# Patient Record
Sex: Male | Born: 1950
Health system: Southern US, Community
[De-identification: ages and names within clinical notes are randomized; demographics above are authoritative.]

## PROBLEM LIST (undated history)

## (undated) DIAGNOSIS — N200 Calculus of kidney: Secondary | ICD-10-CM

## (undated) DIAGNOSIS — F419 Anxiety disorder, unspecified: Secondary | ICD-10-CM

## (undated) DIAGNOSIS — T7840XA Allergy, unspecified, initial encounter: Secondary | ICD-10-CM

## (undated) DIAGNOSIS — G629 Polyneuropathy, unspecified: Secondary | ICD-10-CM

## (undated) DIAGNOSIS — M1990 Unspecified osteoarthritis, unspecified site: Secondary | ICD-10-CM

## (undated) DIAGNOSIS — H269 Unspecified cataract: Secondary | ICD-10-CM

## (undated) DIAGNOSIS — C801 Malignant (primary) neoplasm, unspecified: Secondary | ICD-10-CM

## (undated) DIAGNOSIS — I10 Essential (primary) hypertension: Secondary | ICD-10-CM

## (undated) DIAGNOSIS — K219 Gastro-esophageal reflux disease without esophagitis: Secondary | ICD-10-CM

## (undated) DIAGNOSIS — E785 Hyperlipidemia, unspecified: Secondary | ICD-10-CM

## (undated) DIAGNOSIS — R011 Cardiac murmur, unspecified: Secondary | ICD-10-CM

## (undated) HISTORY — DX: Gastro-esophageal reflux disease without esophagitis: K21.9

## (undated) HISTORY — DX: Essential (primary) hypertension: I10

## (undated) HISTORY — DX: Hyperlipidemia, unspecified: E78.5

## (undated) HISTORY — DX: Unspecified cataract: H26.9

## (undated) HISTORY — DX: Anxiety disorder, unspecified: F41.9

## (undated) HISTORY — PX: CERVICAL DISCECTOMY: SHX98

## (undated) HISTORY — DX: Allergy, unspecified, initial encounter: T78.40XA

## (undated) HISTORY — DX: Polyneuropathy, unspecified: G62.9

## (undated) HISTORY — DX: Calculus of kidney: N20.0

## (undated) HISTORY — PX: CERVICAL FUSION: SHX112

## (undated) HISTORY — PX: OTHER SURGICAL HISTORY: SHX169

## (undated) HISTORY — PX: SPINE SURGERY: SHX786

## (undated) HISTORY — DX: Cardiac murmur, unspecified: R01.1

## (undated) HISTORY — DX: Unspecified osteoarthritis, unspecified site: M19.90

---

## 2000-04-07 ENCOUNTER — Ambulatory Visit (HOSPITAL_COMMUNITY): Admission: RE | Admit: 2000-04-07 | Discharge: 2000-04-07 | Payer: Self-pay | Admitting: Neurosurgery

## 2000-04-07 ENCOUNTER — Encounter: Payer: Self-pay | Admitting: Neurosurgery

## 2000-04-22 ENCOUNTER — Encounter: Payer: Self-pay | Admitting: Neurosurgery

## 2000-04-22 ENCOUNTER — Ambulatory Visit (HOSPITAL_COMMUNITY): Admission: RE | Admit: 2000-04-22 | Discharge: 2000-04-22 | Payer: Self-pay | Admitting: Neurosurgery

## 2000-12-01 ENCOUNTER — Ambulatory Visit (HOSPITAL_COMMUNITY): Admission: RE | Admit: 2000-12-01 | Discharge: 2000-12-01 | Payer: Self-pay | Admitting: Neurosurgery

## 2000-12-01 ENCOUNTER — Encounter: Payer: Self-pay | Admitting: Neurosurgery

## 2000-12-09 ENCOUNTER — Inpatient Hospital Stay (HOSPITAL_COMMUNITY): Admission: RE | Admit: 2000-12-09 | Discharge: 2000-12-10 | Payer: Self-pay | Admitting: Neurosurgery

## 2000-12-09 ENCOUNTER — Encounter: Payer: Self-pay | Admitting: Neurosurgery

## 2001-08-13 ENCOUNTER — Ambulatory Visit (HOSPITAL_COMMUNITY): Admission: RE | Admit: 2001-08-13 | Discharge: 2001-08-13 | Payer: Self-pay | Admitting: *Deleted

## 2001-08-13 ENCOUNTER — Encounter: Payer: Self-pay | Admitting: Cardiology

## 2005-01-30 ENCOUNTER — Ambulatory Visit (HOSPITAL_COMMUNITY): Admission: RE | Admit: 2005-01-30 | Discharge: 2005-01-30 | Payer: Self-pay | Admitting: Urology

## 2005-02-06 ENCOUNTER — Ambulatory Visit (HOSPITAL_COMMUNITY): Admission: RE | Admit: 2005-02-06 | Discharge: 2005-02-06 | Payer: Self-pay | Admitting: Urology

## 2006-11-14 ENCOUNTER — Inpatient Hospital Stay (HOSPITAL_COMMUNITY): Admission: RE | Admit: 2006-11-14 | Discharge: 2006-11-15 | Payer: Self-pay | Admitting: Neurosurgery

## 2007-04-30 ENCOUNTER — Emergency Department (HOSPITAL_COMMUNITY): Admission: EM | Admit: 2007-04-30 | Discharge: 2007-04-30 | Payer: Self-pay | Admitting: Emergency Medicine

## 2007-07-21 ENCOUNTER — Encounter: Admission: RE | Admit: 2007-07-21 | Discharge: 2007-07-21 | Payer: Self-pay | Admitting: Neurosurgery

## 2009-08-22 ENCOUNTER — Encounter: Payer: Self-pay | Admitting: Cardiology

## 2010-05-02 ENCOUNTER — Encounter: Payer: Self-pay | Admitting: Cardiology

## 2010-05-15 ENCOUNTER — Encounter: Payer: Self-pay | Admitting: Cardiology

## 2010-06-01 DIAGNOSIS — G4700 Insomnia, unspecified: Secondary | ICD-10-CM | POA: Insufficient documentation

## 2010-06-01 DIAGNOSIS — E785 Hyperlipidemia, unspecified: Secondary | ICD-10-CM | POA: Insufficient documentation

## 2010-06-01 DIAGNOSIS — I10 Essential (primary) hypertension: Secondary | ICD-10-CM | POA: Insufficient documentation

## 2010-06-01 DIAGNOSIS — M47816 Spondylosis without myelopathy or radiculopathy, lumbar region: Secondary | ICD-10-CM | POA: Insufficient documentation

## 2010-06-01 DIAGNOSIS — K219 Gastro-esophageal reflux disease without esophagitis: Secondary | ICD-10-CM | POA: Insufficient documentation

## 2010-06-04 ENCOUNTER — Ambulatory Visit: Payer: Self-pay | Admitting: Cardiology

## 2010-06-04 DIAGNOSIS — I2510 Atherosclerotic heart disease of native coronary artery without angina pectoris: Secondary | ICD-10-CM | POA: Insufficient documentation

## 2010-07-25 ENCOUNTER — Encounter (INDEPENDENT_AMBULATORY_CARE_PROVIDER_SITE_OTHER): Payer: Self-pay | Admitting: *Deleted

## 2010-08-01 ENCOUNTER — Encounter: Payer: Self-pay | Admitting: Cardiology

## 2010-08-10 ENCOUNTER — Encounter (INDEPENDENT_AMBULATORY_CARE_PROVIDER_SITE_OTHER): Payer: Self-pay | Admitting: *Deleted

## 2010-11-20 NOTE — Letter (Signed)
Summary: External Correspondence/ PROGRESS NOTE DR.BLUTH  External Correspondence/ PROGRESS NOTE DR.BLUTH   Imported By: Dorise Hiss 05/21/2010 08:21:13  _____________________________________________________________________  External Attachment:    Type:   Image     Comment:   External Document

## 2010-11-20 NOTE — Letter (Signed)
Summary: Generic Engineer, agricultural at [facility] [address]   [place], [place] [address]   Phone: [phone]  Fax: [phone]        July 25, 2010 MRN: [id number]    MOHAB ASHBY [address] [place], [place]  [address]    Dear Mr. Balboni,   According to our records, you are due to have lab work to follow up on your cholesterol and liver function.   Please, take the enclosed order to the [facility] at your earliest convenience to have this done. Do not eat or drink after midnight.       Sincerely,  Cyril Loosen, RN, BSN  This letter has been electronically signed by your physician.

## 2010-11-20 NOTE — Cardiovascular Report (Signed)
Summary: Cardiac Catheterization  Cardiac Catheterization   Imported By: Dorise Hiss 06/04/2010 09:29:06  _____________________________________________________________________  External Attachment:    Type:   Image     Comment:   External Document

## 2010-11-20 NOTE — Letter (Signed)
Summary: Engineer, materials at [facility]  [address]   [place], [place] [address]   Phone: [phone]  Fax: [phone]        August 10, 2010 MRN: [id number]    KALID GHAN [address] [place], [place]  [address]    Dear Mr. Knoth,  Your test ordered by Selena Batten has been reviewed by your physician (or physician assistant) and was found to be normal or stable. Your physician (or physician assistant) felt no changes were needed at this time.  ____ Echocardiogram  ____ Cardiac Stress Test  _X___ Lab Work  ____ Peripheral vascular study of arms, legs or neck  ____ CT scan or X-ray  ____ Lung or Breathing test  ____ Other:   Thank you.   Cyril Loosen, RN, BSN    Duane Boston, M.D., F.A.C.C. Thressa Sheller, M.D., F.A.C.C. Oneal Grout, M.D., F.A.C.C. Cheree Ditto, M.D., F.A.C.C. Daiva Nakayama, M.D., F.A.C.C. Kenney Houseman, M.D., F.A.C.C. Jeanne Ivan, PA-C

## 2010-11-20 NOTE — Assessment & Plan Note (Signed)
Summary: NP-HYPERLIPIDEMIA   Visit Type:  Initial Consult Primary Provider:  Lonzo Candy  CC:  Hyperlipidemia.  History of Present Illness: The patient presents for evaluation of difficult to control dyslipidemia. He has a history of chest discomfort and had a cardiac catheterization in 2002 with minimal coronary plaque (RCA 30% stenosis). He has had very difficult to control lipids and has not tolerated various statins and most recently niacin. His most recent cholesterol was a total 2:30, triglycerides 158, HDL 34 and LDL 164. He is somewhat limited by degenerative disc disease and is on chronic pain management. He also recently was found to have a neuropathy. With his levels of activity he denies any chest pressure, neck or arm discomfort. He denies any presyncope or syncope. He has had no PND or orthopnea.  He does have PVCs  Preventive Screening-Counseling & Management  Alcohol-Tobacco     Smoking Status: quit     Year Quit: 1995  Current Medications (verified): 1)  Omeprazole 20 Mg Cpdr (Omeprazole) .... Take 1 Tablet By Mouth Once A Day 2)  Neurontin 800 Mg Tabs (Gabapentin) .... Take 1 Tablet By Mouth Three Times A Day 3)  Ms Contin 30 Mg Xr12h-Tab (Morphine Sulfate) .... Take 1 Tablet By Mouth Two Times A Day 4)  Trazodone Hcl 50 Mg Tabs (Trazodone Hcl) .... Take 1-3 Tablet By Mouth Once A Day 5)  Morphine Sulfate 15 Mg Tabs (Morphine Sulfate) .... Take 1 Tablet By Mouth Two Times A Day As Needed  Allergies (verified): 1)  ! * Statins 2)  ! Ace Inhibitors 3)  ! Asa 4)  ! * Cox 2 Inhibitor 5)  ! * Gemfibrozil  Comments:  Nurse/Medical Assistant: The patient's medication bottles and allergies were reviewed with the patient and were updated in the Medication and Allergy Lists.  Past History:  Past Medical History: Anxiety Hypertension  Insomnia Neprholithiasis Chronic pain (DDD) Peripheral neuropathy  Past Surgical History: Cardiac Catheterization-2002 Anterior  5-6, 6-7 diskectomy, decompression of the spinal  cord, decompression of the nerve roots, interbody fusion with allograft  Family History: Father: alive DM/HTN/CAD (CAD starting age 2s)  Social History: Retired -Statistician Disabled  Married  Has 2 grown children Regular Exercise - yes Tobacc smoked years ago Smoking Status:  quit  Review of Systems       As stated in the HPI and negative for all other systems.   Vital Signs:  Patient profile:   60 year old male Height:      66 inches Weight:      202 pounds BMI:     32.72 Pulse rate:   70 / minute BP sitting:   123 / 82  (left arm) Cuff size:   large  Vitals Entered By: Carlye Grippe (June 04, 2010 10:47 AM)  Nutrition Counseling: Patient's BMI is greater than 25 and therefore counseled on weight management options. CC: Hyperlipidemia   Physical Exam  General:  Well developed, well nourished, in no acute distress. Head:  normocephalic and atraumatic Eyes:  PERRLA/EOM intact; conjunctiva and lids normal. Mouth:  Dentures. Oral mucosa normal. Neck:  Neck supple, no JVD. No masses, thyromegaly or abnormal cervical nodes. Chest Wall:  no deformities or breast masses noted Lungs:  Clear bilaterally to auscultation and percussion. Abdomen:  Bowel sounds positive; abdomen soft and non-tender without masses, organomegaly, or hernias noted. No hepatosplenomegaly. Msk:  Back normal, normal gait. Muscle strength and tone normal. Extremities:  No clubbing or cyanosis. Neurologic:  Alert and  oriented x 3. Skin:  Intact without lesions or rashes. Cervical Nodes:  no significant adenopathy Axillary Nodes:  no significant adenopathy Inguinal Nodes:  no significant adenopathy Psych:  Normal affect.   Detailed Cardiovascular Exam  Neck    Carotids: Carotids full and equal bilaterally without bruits.      Neck Veins: Normal, no JVD.    Heart    Inspection: no deformities or lifts noted.      Palpation: normal PMI  with no thrills palpable.      Auscultation: Very brief systolic murmur at the apex  Vascular    Abdominal Aorta: no palpable masses, pulsations, or audible bruits.      Femoral Pulses: normal femoral pulses bilaterally.      Pedal Pulses: normal pedal pulses bilaterally.      Radial Pulses: normal radial pulses bilaterally.      Peripheral Circulation: no clubbing, cyanosis, or edema noted with normal capillary refill.     EKG  Procedure date:  06/04/2010  Findings:      Sinus rhythm, rate 69, axis within normal limits, intervals within normal limits, no acute ST-T wave changes  Impression & Recommendations:  Problem # 1:  HYPERLIPIDEMIA (ICD-272.4) The patient's goal LDL would be less than 130 with an HDL of 34. He does not recall being on pravastatin which is the statin least likely to cause myopathies. I will start this at 40 mg daily and also prescribe coenzyme Q. which has data demonstrating reduced myopathies. If he tolerates this he can get a lipid profile in about 8 weeks to see where we stand. Future Orders: T-Lipid Profile [phone]) ... 07/23/2010 T-Hepatic Function [phone]) ... 07/23/2010  Problem # 2:  HYPERTENSION (ICD-401.9) His blood pressure is controlled and he will continue the meds as listed. Orders: EKG w/ Interpretation ([id number])  Problem # 3:  CORONARY ARTERY DISEASE (ICD-414.00) He had minimal coronary plaque. I agree with aggressive risk reduction. He has no symptoms and not suggesting further testing at this point.  Patient Instructions: 1)  Your physician wants you to follow-up in: 3 months. You will receive a reminder letter in the mail one-two months in advance. If you don't receive a letter, please call our office to schedule the follow-up appointment. 2)  Start Pravastatin 40mg  by mouth at bedtime. 3)  Your physician recommends that you go to the [facility] for a FASTING lipid profile and liver function labs:  DO IN 8 WEEKS AROUND  07/30/10-Do not eat or drink after midnight. If the results of your test are normal or stable, you will receive a letter. If they are abnormal, the nurse will contact you by phone.  4)  CoQ-10 (Coenzyme q10) Prescriptions: PRAVASTATIN SODIUM 40 MG TABS (PRAVASTATIN SODIUM) Take one tablet by mouth daily at bedtime  #30 x 6   Entered by:   Cyril Loosen, RN, BSN   Authorized by:   Rollene Rotunda, MD, [facility]   Signed by:   Cyril Loosen, RN, BSN on 06/04/2010   Method used:   Electronically to        [facility]* (retail)       [address]       [place], [place]  [address]       Ph: [phone]       Fax: [phone]   RxID:   [id number]   Handout requested.  I have reviewed and approved all prescriptions at the time of this  visit. Rollene Rotunda, MD, [facility]  June 04, 2010 11:21 AM

## 2010-11-20 NOTE — Letter (Signed)
Summary: External Correspondence/ NOTE [facility]  External Correspondence/ NOTE [facility]   Imported By: Dorise Hiss 05/21/2010 08:23:10  _____________________________________________________________________  External Attachment:    Type:   Image     Comment:   External Document

## 2011-03-08 NOTE — H&P (Signed)
NAME:  MORIO, WIDEN NO.:  [id number]   MEDICAL RECORD NO.:  [id number]          PATIENT TYPE:  AMB   LOCATION:  DAY                           FACILITY:  [facility]   PHYSICIAN:  Ky Barban, M.D.DATE OF BIRTH:  1951/09/09   DATE OF ADMISSION:  DATE OF DISCHARGE:  LH                                HISTORY & PHYSICAL   CHIEF COMPLAINT:  Left renal colic.   HISTORY:  This 60 year old gentleman has left renal colic.  He came to the  emergency room about a week ago.  He had a 7 x 9 mm stone in the left  proximal ureter.  Subsequently I put a double-J stent.  I advised him to  undergo ESL.  The procedure and risks, limitations and complications were  discussed.  He understands, so he is coming as an outpatient to have it  done.   PAST MEDICAL HISTORY:  1.  Kidney stones 15 years ago.  2.  Hypertension.  3.  Lumbar disk problems.   FAMILY HISTORY:  Positive for high blood pressure.   PERSONAL HISTORY:  Does not smoke or drink.   REVIEW OF SYSTEMS:  Unremarkable.   PHYSICAL EXAMINATION:  VITAL SIGNS:  Blood pressure 140/80, temperature  normal.  CENTRAL NERVOUS SYSTEM:  Negative.  HEENT:  Negative.  CHEST:  Symmetrical.  HEART:  Regular sinus rhythm.  ABDOMEN:  Soft and flat.  Liver, spleen and kidneys not palpable.  No CVA  tenderness.  GENITALIA:  External genitalia are unremarkable.  RECTAL:  Exam is deferred.  EXTREMITIES:  Normal.   IMPRESSION:  Left renal calculus.   PLAN:  ESL as outpatient.      MIJ/MEDQ  D:  01/29/2005  T:  01/29/2005  Job:  [id number]

## 2011-03-08 NOTE — Cardiovascular Report (Signed)
[place]  Patient:    MACE, WEINBERG Visit Number: [id number] MRN: [id number]          Service Type: CAT Location: [facility] 2899 11 Attending Physician:  Daisey Must Dictated by:   Daisey Must, M.D. [facility] Proc. Date: 08/13/01 Admit Date:  08/13/2001 Discharge Date: 08/13/2001                          Cardiac Catheterization  NO DICTATION Dictated by:   Daisey Must, M.D. [facility] Attending Physician:  Daisey Must DD:  08/13/01 TD:  08/14/01 Job: [id number]

## 2011-03-08 NOTE — Op Note (Signed)
NAME:  Jonathan Gibson, Jonathan Gibson NO.:  [id number]   MEDICAL RECORD NO.:  [id number]          PATIENT TYPE:  INP   LOCATION:  3172                         FACILITY:  [facility]   PHYSICIAN:  Hilda Lias, M.D.   DATE OF BIRTH:  [birth year]   DATE OF PROCEDURE:  11/14/2006  DATE OF DISCHARGE:                               OPERATIVE REPORT   PREOPERATIVE DIAGNOSIS:  C5-C6, C6-C7 spondylosis with radiculopathy.   POSTOPERATIVE DIAGNOSIS:  C5-C6, C6-C7 spondylosis with radiculopathy.   PROCEDURES:  Anterior 5-6, 6-7 diskectomy, decompression of the spinal  cord, decompression of the nerve roots, interbody fusion with allograft  and plate, microscope.   SURGEON:  Hilda Lias, M.D.   ASSISTANT:  Clydene Fake, M.D.   CLINICAL HISTORY:  The patient was admitted because of neck pain with  radiation to both upper extremities associated with weakness of the  biceps and triceps.  X-rays show severe spondylosis at the levels of 5-6  and 6-7.  The patient wanted to proceed with surgery after he failed  conservative treatment in several hospitals.  The risks were explained  in the History and Physical.   DESCRIPTION OF PROCEDURE:  The patient was taken to the OR, and after  intubation, the neck was cleaned with DuraPrep.  A transverse incision  was made through the skin, subcutaneous tissue and platysma down to the  cervical spine.  X-rays showed that, indeed, we were at the level of 5-  6.  Then, large osteophytes at the level of 5-6 and 6-7 were removed.  We brought in the microscope, and the anterior ligament over both places  was opened.  Then, with the use of the microcurets, we were able to  remove the degenerated disk at the level of 5-6.  The patient had quite  a bit of central and lateral stenosis, and decompression was achieved.  The same procedure was done at the level of C6-C7, which was worse.  The  patient had more central stenosis.  Decompression of the cord as well  as  the foramina was achieved.  At the end, the endplates were drilled.  A 7-  mm allograft between C5-6 was inserted, and between 6-7, it was nicely  sized down to 8 mm.  This was followed by a plate using 6 screws.  Lateral cervical spine showed good position of the graft and the plate  at the level of 5-6, and we were unable to see C6-7.  Although we  accomplished good hemostasis, we left a drain in the precervical area.  From then on, the wound was closed with Vicryl and Steri-Strips.           ______________________________  Hilda Lias, M.D.     EB/MEDQ  D:  11/14/2006  T:  11/14/2006  Job:  [id number]   cc:   Clydene Fake, M.D.

## 2011-03-08 NOTE — Cardiovascular Report (Signed)
[place]  Patient:    Jonathan Gibson, Jonathan Gibson Visit Number: [id number] MRN: [id number]          Service Type: CAT Location: [facility] 2899 11 Attending Physician:  Daisey Must Dictated by:   Daisey Must, M.D. [facility] Proc. Date: 08/13/01 Admit Date:  08/13/2001 Discharge Date: 08/13/2001   CC:         Weyman Pedro, M.D.  [facility] [place]  Cardiac Catheterization Laboratory   Cardiac Catheterization  PROCEDURES PERFORMED: Left heart catheterization with coronary angiography and left ventriculography.  INDICATIONS: The patient is a 60 year old male, who has been having episodes of lightheadedness and palpitations. As part of his work-up a stress Cardiolite was performed which suggested possible ischemia in the distal LAD distribution. He was referred for cardiac catheterization to rule out coronary artery disease.  DESCRIPTION OF PROCEDURE: A 6 French sheath was placed in the right femoral artery. Standard Judkins 6 French catheters were utilized.  Contrast was Omnipaque. There were no complications.  RESULTS:  HEMODYNAMICS: Left ventricular pressure 120/16. Aortic pressure 120/74.  There was no aortic valve gradient.  LEFT VENTRICULOGRAM: Wall motion is normal. Ejection fraction estimated at 60%. There is trace mitral regurgitation.  CORONARY ARTERIOGRAPHY: (Right dominant).  Left main: Normal.  Left anterior descending: The left anterior descending artery has minor luminal irregularities in the proximal to mid vessel.  The LAD gives rise to a normal sized branching first diagonal, small second and third diagonal branches.  Left circumflex: The left circumflex has a focal area of ectasia in the mid vessel.  It gives rise to a small first and second marginal branch and a large third obtuse marginal branch.  Right coronary artery: The right coronary artery is a dominant vessel. There is a smooth 30% stenosis at its origin which  may be partly related to catheter-induced spasm. This did improve with intracoronary nitroglycerin. There were minor luminal irregularities in the mid right coronary artery.  The distal right coronary artery gives rise to a large posterior descending artery and a normal sized posterolateral branch.  IMPRESSIONS: 1. Normal left ventricular systolic function. 2. No significant coronary artery disease identified. Dictated by:   Daisey Must, M.D. [facility] Attending Physician:  Daisey Must DD:  08/13/01 TD:  08/14/01 Job: [id number]

## 2011-03-08 NOTE — Op Note (Signed)
[place]  Patient:    Jonathan Gibson, Jonathan Gibson                    MRN: [id number] Proc. Date: 12/09/00 Adm. Date:  [id number] Attending:  Danella Penton                           Operative Report  PREOPERATIVE DIAGNOSIS:  Left L5-S1 herniated disk with a chronic S1 radiculopathy.  POSTOPERATIVE DIAGNOSIS:  Left L5-S1 herniated disk with a chronic S1 radiculopathy.  PROCEDURES:  Left L5-S1 microdiskectomy, lysis of adhesions, foraminotomy, microscope.  SURGEON:  Tanya Nones. Jeral Fruit, M.D.  ASSISTANTMena Goes. Franky Macho, M.D.  INDICATION:  Mr. Radloff is a gentleman who had been complaining of back pain with radiation down to his left leg for several months.  Since this is getting worse, a myelogram was done, which showed that, indeed, he has a herniated disk at the level of 5-1, affecting the S1 nerve root.  The sixth lumbar disk _____ calcified.  The patient was knew about the risks, such as infection, CSF leak, worsening pain, paralysis, need for further surgery.  DESCRIPTION OF PROCEDURE:  The patient was taken to the OR, where he was positioned in a prone manner.  The back was prepped with Betadine.  A midline incision was made from L5 to S1.  Muscle was retracted on the left side.  We took an x-ray, and this showed that, indeed, we were in the _____.  From then on, we identified the lower one, and with a drill we drilled the lower lamina of L5, the upper of S1.  We brought the microscope and with microdissection removing the thick yellow ligament.  Indeed, the S1 nerve root was found to be swollen and reddish.  Dissection was done, lysis was achieved, and then we were able to mobilize the S1 nerve root.  There was a herniated disk affecting the takeoff of the S1 nerve root.  Incision was brought down, and total gross diskectomy with removal of calcified disk was removed.  Foraminotomy was accomplished.  From then on, the area was _____,  which was negative.  Having done this, Depo-Medrol and fentanyl were left in the epidural space, and the wound was closed with Vicryl and a Steri-Strip. DD:  12/09/00 TD:  12/09/00 Job: [id number]

## 2011-03-08 NOTE — H&P (Signed)
NAME:  Jonathan Gibson, Jonathan Gibson NO.:  [id number]   MEDICAL RECORD NO.:  [id number]          PATIENT TYPE:  INP   LOCATION:  2899                         FACILITY:  [facility]   PHYSICIAN:  Hilda Lias, M.D.   DATE OF BIRTH:  05/31/1951   DATE OF ADMISSION:  11/14/2006  DATE OF DISCHARGE:                              HISTORY & PHYSICAL   HISTORY OF PRESENT ILLNESS:  Jonathan Gibson is a gentleman who was seen by  me because of neck pain with radiation to the shoulder down to the upper  thoracic area.  Patient said he had an injection at [facility],  without relief.  He had been having difficulty moving his neck and  driving, having made the pain worse.  He had an MRI and because of the  findings he is being admitted for surgery.   PAST MEDICAL HISTORY:  Lumbar diskectomy.   ALLERGIES:  He is not allergic to medications.   SOCIAL HISTORY:  Negative.   FAMILY HISTORY:  Mother is in good condition at 91 years old.  Father is  41 with diabetes.   REVIEW OF SYSTEMS:  Positive for high cholesterol, high blood pressure,  neck pain, back pain.   PHYSICAL EXAMINATION:  GENERAL:  When the patient came to my office, he  was keeping his neck straight to prevent pain.  HEENT:  Head is normal.  Neck:  He has an almost completely frozen neck.  LUNGS:  Clear.  HEART:  Heart:  Heart sounds normal.  EXTREMITIES:  Within normal limits.  NEURO/MENTAL STATUS:  Normal.  Strength:  I can bend easily both biceps  and the wrist extensor is weak on the right side.  No Babinski.   X-rays showed that he has degenerative disk disease at the level of 5-6  and 6-7.  The MRI showed that he has severe spondylosis at the level of  5-6 and 6-7 with bilateral foraminal compromise.   IMPRESSION:  C5-C6 and C6-7 spondylosis with radiculopathy.   RECOMMENDATIONS:  The patient is being admitted for surgery.  The  procedure will be anterior cervical diskectomy and fusion of the level 5-  6 and 6-7.  He  knows all the risks, such as infection, damage to cord,  damage to esophagus, need for further surgery, paralysis, failure of the  hardware, and hematoma.           ______________________________  Hilda Lias, M.D.    EB/MEDQ  D:  11/14/2006  T:  11/15/2006  Job:  [id number]

## 2011-06-07 ENCOUNTER — Other Ambulatory Visit: Payer: Self-pay | Admitting: Cardiology

## 2011-06-07 DIAGNOSIS — E785 Hyperlipidemia, unspecified: Secondary | ICD-10-CM

## 2012-02-09 ENCOUNTER — Other Ambulatory Visit: Payer: Self-pay | Admitting: Cardiology

## 2012-02-10 NOTE — Telephone Encounter (Signed)
..   Requested Prescriptions   Pending Prescriptions Disp Refills  . pravastatin (PRAVACHOL) 40 MG tablet [Pharmacy Med Name: PRAVASTATIN TABS 40MG ] 30 tablet 1    Sig: TAKE 1 TABLET DAILY AT BEDTIME

## 2013-04-03 DIAGNOSIS — R079 Chest pain, unspecified: Secondary | ICD-10-CM

## 2013-04-04 DIAGNOSIS — R079 Chest pain, unspecified: Secondary | ICD-10-CM

## 2013-04-05 ENCOUNTER — Encounter: Payer: Self-pay | Admitting: Cardiology

## 2014-08-24 ENCOUNTER — Ambulatory Visit (INDEPENDENT_AMBULATORY_CARE_PROVIDER_SITE_OTHER): Payer: Medicare Other | Admitting: Physician Assistant

## 2014-08-24 ENCOUNTER — Encounter: Payer: Self-pay | Admitting: Physician Assistant

## 2014-08-24 VITALS — BP 130/88 | Ht 68.0 in | Wt 208.0 lb

## 2014-08-24 DIAGNOSIS — I2510 Atherosclerotic heart disease of native coronary artery without angina pectoris: Secondary | ICD-10-CM

## 2014-08-24 DIAGNOSIS — R002 Palpitations: Secondary | ICD-10-CM

## 2014-08-24 DIAGNOSIS — E785 Hyperlipidemia, unspecified: Secondary | ICD-10-CM

## 2014-08-24 DIAGNOSIS — I10 Essential (primary) hypertension: Secondary | ICD-10-CM

## 2014-08-24 NOTE — Assessment & Plan Note (Signed)
Patient has tolerated Pravachol. Most recent lipids by primary M.D. Were stable.

## 2014-08-24 NOTE — Progress Notes (Signed)
Jonathan Gibson is a 63 year old know patient who saw Dr.Hochrein back in 2011 but has been lost to followup. He has a history of chest pain with cardiac catheterization in 2002 revealing minimal coronary plaque with 30% RCA stenosis and normal LV function. He has had difficult to control lipids and has been non-tolerant to most statins as well as niacin. He also has hypertension and DJD.  Patient comes in today complaining of one and half week history of pounding heart. He says every time he walks up a hill or does any activity he feels his heart pounding. He says it does not go fast or skip. He denies any associated chest tightness, pressure, dyspnea, dyspnea on exertion, dizziness, or presyncope. He said he had PVCs back in 2003. He also had surgery on his forehead in [place] for aa dermatofibroscarcoma and was told he had a heart murmur.He said he had a hospitalization for chest pain a year ago and had a stress test that was okay.Patient denies any increase in anxiety or stress, excessive caffeine or alcohol intake or taking any over-the-counter medications.  Allergies  Allergen Reactions  . Ace Inhibitors     REACTION: cough  . Aspirin     REACTION: GI upset  . Gemfibrozil     REACTION: muscle aches  . Statins     REACTION: myalgia     Current Outpatient Prescriptions  Medication Sig Dispense Refill  . aspirin 81 MG chewable tablet Chew 81 mg by mouth.    . gabapentin (NEURONTIN) 600 MG tablet Take 600 mg by mouth daily.    Marland Kitchen losartan (COZAAR) 50 MG tablet Take 50 mg by mouth daily.    Marland Kitchen omeprazole (PRILOSEC) 20 MG capsule Take 20 mg by mouth.    . pravastatin (PRAVACHOL) 40 MG tablet TAKE 1 TABLET DAILY AT BEDTIME 30 tablet 1  . traZODone (DESYREL) 100 MG tablet Take 50 mg by mouth 3 (three) times daily.      No current facility-administered medications for this visit.    dermatofibrosarcoma protuberans of face, S/P Mohs procedure with large scalp defect   Father with CAD  age 64  History   Social History  . Marital Status: Married    Spouse Name: N/A    Number of Children: N/A  . Years of Education: N/A   Occupational History  . Not on file.   Social History Main Topics  . Smoking status: Never Smoker   . Smokeless tobacco: Never Used  . Alcohol Use: Not on file  . Drug Use: Not on file  . Sexual Activity: Not on file   Other Topics Concern  . Not on file   Social History Narrative  . No narrative on file    ROS:see history of present illness otherwise negative  BP 130/88 mmHg  Ht 5\' 8"  (1.727 m)  Wt 208 lb (94.348 kg)  BMI 31.63 kg/m2  PHYSICAL EXAM: Well-nournished, in no acute distress. Neck: No JVD, HJR, Bruit, or thyroid enlargement  Lungs: No tachypnea, clear without wheezing, rales, or rhonchi  Cardiovascular: RRR, PMI not displaced, Normal S1 and S2, 7-4/1 systolic murmur at the apex,no gallops, bruit, thrill, or heave.  Abdomen: BS normal. Soft without organomegaly, masses, lesions or tenderness.  Extremities: without cyanosis, clubbing or edema. Good distal pulses bilateral  SKin: Warm, no lesions or rashes   Musculoskeletal: No deformities  Neuro: no focal signs   Wt Readings from Last 3 Encounters:  08/24/14 208 lb (94.348 kg)  06/04/10 202 lb (91.627 kg)    No results found for: WBC, HGB, HCT, PLT, GLUCOSE, CHOL, TRIG, HDL, LDLDIRECT, LDLCALC, ALT, AST, NA, K, CL, CREATININE, BUN, CO2, TSH, PSA, INR, GLUF, HGBA1C, MICROALBUR  JGO:TLXBWI sinus rhythm with moderate LVH, no acute change   Cardiac catheterization in 2002 IMPRESSIONS: 1. Normal left ventricular systolic function. 2. No significant coronary artery disease identified. Dictated by:   Allene Dillon, M.D. [place] Attending Physician:  Allene Dillon DD:  08/13/01 TD:  08/14/01 Job: [id number]

## 2014-08-24 NOTE — Assessment & Plan Note (Signed)
Patient is one half week history of palpitations that are bothersome to him. His heart does not beat fast or irregular. He does feel the pounding. We'll place an event monitor to document his symptoms. He has no associated chest pain or presyncope with it. We'll also order a 2-D echo for LV function and systolic murmur.check bmet and TSH.

## 2014-08-24 NOTE — Assessment & Plan Note (Signed)
Blood pressure controlled on Cozaar. Managed by primary care.

## 2014-08-24 NOTE — Patient Instructions (Signed)
Your physician recommends that you schedule a follow-up appointment in: [place]  Your physician recommends that you continue on your current medications as directed. Please refer to the Current Medication list given to you today.  Your physician has recommended that you wear an event monitor FOR 30 DAYS. Event monitors are medical devices that record the heart's electrical activity. Doctors most often [place] these monitors to diagnose arrhythmias. Arrhythmias are problems with the speed or rhythm of the heartbeat. The monitor is a small, portable device. You can wear one while you do your normal daily activities. This is usually used to diagnose what is causing palpitations/syncope (passing out).  Your physician has requested that you have an echocardiogram. Echocardiography is a painless test that uses sound waves to create images of your heart. It provides your doctor with information about the size and shape of your heart and how well your heart's chambers and valves are working. This procedure takes approximately one hour. There are no restrictions for this procedure.  Your physician recommends that you return for lab work in: BMP TSH  Thank you for choosing [facility]!!

## 2014-08-24 NOTE — Assessment & Plan Note (Signed)
Patient has had no chest pain. Cardiac catheterization in 2002Showed nonobstructive CAD with normal LV function 30% RCA. Patient apparently had a stress test last year that was normal. We'll try to obtain those results.

## 2014-08-25 ENCOUNTER — Ambulatory Visit (HOSPITAL_COMMUNITY)
Admission: RE | Admit: 2014-08-25 | Discharge: 2014-08-25 | Disposition: A | Discharge status: Home / Self Care | Payer: Medicare Other | Source: Ambulatory Visit | Attending: Physician Assistant | Admitting: Physician Assistant

## 2014-08-25 ENCOUNTER — Other Ambulatory Visit: Payer: Self-pay

## 2014-08-25 ENCOUNTER — Encounter: Payer: Self-pay | Admitting: Physician Assistant

## 2014-08-25 DIAGNOSIS — I2510 Atherosclerotic heart disease of native coronary artery without angina pectoris: Secondary | ICD-10-CM | POA: Diagnosis not present

## 2014-08-25 DIAGNOSIS — I361 Nonrheumatic tricuspid (valve) insufficiency: Secondary | ICD-10-CM | POA: Insufficient documentation

## 2014-08-25 DIAGNOSIS — R011 Cardiac murmur, unspecified: Secondary | ICD-10-CM | POA: Diagnosis present

## 2014-08-25 DIAGNOSIS — I10 Essential (primary) hypertension: Secondary | ICD-10-CM | POA: Diagnosis not present

## 2014-08-25 DIAGNOSIS — I319 Disease of pericardium, unspecified: Secondary | ICD-10-CM

## 2014-08-25 DIAGNOSIS — E785 Hyperlipidemia, unspecified: Secondary | ICD-10-CM | POA: Diagnosis not present

## 2014-08-25 DIAGNOSIS — R079 Chest pain, unspecified: Secondary | ICD-10-CM

## 2014-08-25 LAB — BASIC METABOLIC PANEL WITH GFR
BUN: 12 mg/dL (ref 6–23)
CHLORIDE: 107 meq/L (ref 96–112)
CO2: 24 meq/L (ref 19–32)
CREATININE: 1.22 mg/dL (ref 0.50–1.35)
Calcium: 9.3 mg/dL (ref 8.4–10.5)
GFR, EST AFRICAN AMERICAN: 71 mL/min
GFR, EST NON AFRICAN AMERICAN: 62 mL/min
Glucose, Bld: 86 mg/dL (ref 70–99)
Potassium: 4.6 mEq/L (ref 3.5–5.3)
SODIUM: 142 meq/L (ref 135–145)

## 2014-08-25 LAB — TSH: TSH: 2.963 u[IU]/mL (ref 0.350–4.500)

## 2014-08-25 NOTE — Progress Notes (Signed)
  Echocardiogram 2D Echocardiogram has been performed.  [place] 08/25/2014, 11:11 AM

## 2014-08-31 ENCOUNTER — Telehealth: Payer: Self-pay | Admitting: *Deleted

## 2014-08-31 NOTE — Telephone Encounter (Signed)
-----   Message from Imogene Burn, PA-C sent at 08/29/2014  7:50 AM EST ----- Labs including TSH normal

## 2014-09-07 ENCOUNTER — Ambulatory Visit: Payer: Medicare Other | Admitting: Physician Assistant

## 2014-09-12 ENCOUNTER — Telehealth: Payer: Self-pay | Admitting: *Deleted

## 2014-09-12 NOTE — Telephone Encounter (Signed)
-----   Message from Imogene Burn, PA-C sent at 08/29/2014  7:50 AM EST ----- Labs including TSH normal

## 2014-09-26 ENCOUNTER — Telehealth: Payer: Self-pay

## 2014-09-26 NOTE — Telephone Encounter (Signed)
EOS report placed on Dr.Koneswaran's desk for review

## 2014-09-27 ENCOUNTER — Telehealth: Payer: Self-pay | Admitting: *Deleted

## 2014-09-27 ENCOUNTER — Other Ambulatory Visit: Payer: Self-pay | Admitting: *Deleted

## 2014-09-27 DIAGNOSIS — I2510 Atherosclerotic heart disease of native coronary artery without angina pectoris: Secondary | ICD-10-CM

## 2014-09-27 DIAGNOSIS — R002 Palpitations: Secondary | ICD-10-CM

## 2014-09-27 NOTE — Telephone Encounter (Signed)
EOS report per Dr. Bronson Ing Event monitor Sinus rhythm, no symtoms. No recommendations noted. Pt made aware. To be scanned

## 2014-09-29 ENCOUNTER — Encounter: Payer: Self-pay | Admitting: Cardiovascular Disease

## 2014-09-29 ENCOUNTER — Ambulatory Visit (INDEPENDENT_AMBULATORY_CARE_PROVIDER_SITE_OTHER): Payer: Medicare Other | Admitting: Cardiovascular Disease

## 2014-09-29 VITALS — BP 150/92 | HR 60 | Ht 68.0 in | Wt 210.0 lb

## 2014-09-29 DIAGNOSIS — I2510 Atherosclerotic heart disease of native coronary artery without angina pectoris: Secondary | ICD-10-CM

## 2014-09-29 DIAGNOSIS — R002 Palpitations: Secondary | ICD-10-CM

## 2014-09-29 DIAGNOSIS — E785 Hyperlipidemia, unspecified: Secondary | ICD-10-CM

## 2014-09-29 DIAGNOSIS — I10 Essential (primary) hypertension: Secondary | ICD-10-CM

## 2014-09-29 DIAGNOSIS — I2583 Coronary atherosclerosis due to lipid rich plaque: Secondary | ICD-10-CM

## 2014-09-29 DIAGNOSIS — I340 Nonrheumatic mitral (valve) insufficiency: Secondary | ICD-10-CM

## 2014-09-29 NOTE — Patient Instructions (Signed)
Your physician wants you to follow-up in: [place] Bronson Ing You will receive a reminder letter in the mail two months in advance. If you don't receive a letter, please call our office to schedule the follow-up appointment.  Your physician recommends that you continue on your current medications as directed. Please refer to the Current Medication list given to you today.  Your physician has requested that you regularly monitor and record your blood pressure readings at home FOR 1 MONTH AND BRING OR MAIL BACK TO THE OFFICE. Please use the same machine at the same time of day to check your readings and record them to bring to your follow-up visit.  Thank you for choosing [facility]!!

## 2014-09-29 NOTE — Progress Notes (Signed)
Patient ID: Jonathan Gibson, male   DOB: Oct 18, 1951, 63 y.o.   MRN: [id number]      SUBJECTIVE: The patient saw Estella Husk PA-C in November for palpitations. Echocardiography demonstrated normal LV systolic function, EF 85-02%, mild mitral regurgitation, and a small pericardial effusion adjacent to the right ventricle without tamponade physiology. He also has a history of hyperlipidemia, hypertension, and mild nonobstructive CAD, and had a normal stress test (GXT) on 04/05/13, achieving stage 4 of the Bruce protocol and 10.1 METS. TSH and BMET were normal. 30-day event monitor demonstrated normal sinus rhythm and sinus tachycardia, with no symptoms reported. He checks his BP occasionally at home and says it is normal.   Review of Systems: As per "subjective", otherwise negative.  Allergies  Allergen Reactions  . Ace Inhibitors     REACTION: cough  . Aspirin     REACTION: GI upset  . Gemfibrozil     REACTION: muscle aches  . Statins     REACTION: myalgia    Current Outpatient Prescriptions  Medication Sig Dispense Refill  . aspirin 81 MG chewable tablet Chew 81 mg by mouth.    . gabapentin (NEURONTIN) 600 MG tablet Take 600 mg by mouth daily.    Marland Kitchen losartan (COZAAR) 50 MG tablet Take 50 mg by mouth daily.    Marland Kitchen omeprazole (PRILOSEC) 20 MG capsule Take 20 mg by mouth.    . pravastatin (PRAVACHOL) 40 MG tablet TAKE 1 TABLET DAILY AT BEDTIME 30 tablet 1  . traZODone (DESYREL) 100 MG tablet Take 50 mg by mouth 3 (three) times daily.      No current facility-administered medications for this visit.    Past Medical History  Diagnosis Date  . Hyperlipidemia   . Hypertension   . Heart murmur   . Anxiety   . Nephrolithiasis   . Peripheral neuropathy     Past Surgical History  Procedure Laterality Date  . Dermatofibrosarcoma    . Cervical discectomy      History   Social History  . Marital Status: Married    Spouse Name: N/A    Number of Children: N/A  . Years of  Education: N/A   Occupational History  . Not on file.   Social History Main Topics  . Smoking status: Former Smoker    Quit date: 08/24/1994  . Smokeless tobacco: Never Used  . Alcohol Use: Not on file  . Drug Use: Not on file  . Sexual Activity: Not on file   Other Topics Concern  . Not on file   Social History Narrative     Filed Vitals:   09/29/14 0831  BP: 150/92  Pulse: 60  Height: 5\' 8"  (1.727 m)  Weight: 210 lb (95.255 kg)    PHYSICAL EXAM General: NAD HEENT: Normal. Neck: No JVD, no thyromegaly. Lungs: Clear to auscultation bilaterally with normal respiratory effort. CV: Nondisplaced PMI.  Regular rate and rhythm, normal S1/S2, no D7/A1, 1/6 pansystolic murmur at lower left sternal border. No pretibial or periankle edema.  No carotid bruit.  Normal pedal pulses.  Abdomen: Soft, nontender, no hepatosplenomegaly, no distention.  Neurologic: Alert and oriented x 3.  Psych: Normal affect. Skin: Normal. Musculoskeletal: Normal range of motion, no gross deformities. Extremities: No clubbing or cyanosis.   ECG: Most recent ECG reviewed.      ASSESSMENT AND PLAN: 1. Palpitations: No arrhythmias noted with event monitoring, with normal LV systolic function and normal TSH/BMET. No further testing is warranted. 2. Essential HTN:  Elevated today on losartan 50 mg. I have asked the patient to check blood pressure readings 4-5 times per week, at different times throughout the day, in order to get a better approximation of mean BP values. These results will be provided to me at the end of that period so that I can determine if antihypertensive medication titration is indicated. 3. Hyperlipidemia: Stable with pravastatin. No changes. 4. CAD: Mild, nonobstructive with normal GXT in 03/2013. No changes to management. 5. Mitral regurgitation: Mild by echo in 08/2014. Will monitor.  Dispo: f/u 1 year.  Kate Sable, M.D., F.A.C.C.

## 2016-06-04 DIAGNOSIS — M5030 Other cervical disc degeneration, unspecified cervical region: Secondary | ICD-10-CM | POA: Diagnosis not present

## 2016-06-04 DIAGNOSIS — Z6832 Body mass index (BMI) 32.0-32.9, adult: Secondary | ICD-10-CM | POA: Diagnosis not present

## 2016-06-04 DIAGNOSIS — M5412 Radiculopathy, cervical region: Secondary | ICD-10-CM | POA: Diagnosis not present

## 2016-06-06 DIAGNOSIS — M4322 Fusion of spine, cervical region: Secondary | ICD-10-CM | POA: Diagnosis not present

## 2016-06-06 DIAGNOSIS — M542 Cervicalgia: Secondary | ICD-10-CM | POA: Diagnosis not present

## 2016-06-06 DIAGNOSIS — Z6833 Body mass index (BMI) 33.0-33.9, adult: Secondary | ICD-10-CM | POA: Diagnosis not present

## 2016-06-11 DIAGNOSIS — M5412 Radiculopathy, cervical region: Secondary | ICD-10-CM | POA: Diagnosis not present

## 2016-06-11 DIAGNOSIS — M50121 Cervical disc disorder at C4-C5 level with radiculopathy: Secondary | ICD-10-CM | POA: Diagnosis not present

## 2016-06-11 DIAGNOSIS — M4802 Spinal stenosis, cervical region: Secondary | ICD-10-CM | POA: Diagnosis not present

## 2016-06-28 DIAGNOSIS — Z6831 Body mass index (BMI) 31.0-31.9, adult: Secondary | ICD-10-CM | POA: Diagnosis not present

## 2016-06-28 DIAGNOSIS — M5021 Other cervical disc displacement,  high cervical region: Secondary | ICD-10-CM | POA: Diagnosis not present

## 2016-07-25 DIAGNOSIS — Z6832 Body mass index (BMI) 32.0-32.9, adult: Secondary | ICD-10-CM | POA: Diagnosis not present

## 2016-07-25 DIAGNOSIS — M5021 Other cervical disc displacement,  high cervical region: Secondary | ICD-10-CM | POA: Diagnosis not present

## 2016-08-22 DIAGNOSIS — N183 Chronic kidney disease, stage 3 (moderate): Secondary | ICD-10-CM | POA: Diagnosis not present

## 2016-08-22 DIAGNOSIS — E559 Vitamin D deficiency, unspecified: Secondary | ICD-10-CM | POA: Diagnosis not present

## 2016-08-22 DIAGNOSIS — I10 Essential (primary) hypertension: Secondary | ICD-10-CM | POA: Diagnosis not present

## 2016-08-22 DIAGNOSIS — K219 Gastro-esophageal reflux disease without esophagitis: Secondary | ICD-10-CM | POA: Diagnosis not present

## 2016-08-22 DIAGNOSIS — Z125 Encounter for screening for malignant neoplasm of prostate: Secondary | ICD-10-CM | POA: Diagnosis not present

## 2016-08-22 DIAGNOSIS — E784 Other hyperlipidemia: Secondary | ICD-10-CM | POA: Diagnosis not present

## 2016-08-22 DIAGNOSIS — Z0000 Encounter for general adult medical examination without abnormal findings: Secondary | ICD-10-CM | POA: Diagnosis not present

## 2016-08-22 DIAGNOSIS — N139 Obstructive and reflux uropathy, unspecified: Secondary | ICD-10-CM | POA: Diagnosis not present

## 2016-08-22 DIAGNOSIS — E7801 Familial hypercholesterolemia: Secondary | ICD-10-CM | POA: Diagnosis not present

## 2016-08-22 DIAGNOSIS — M5030 Other cervical disc degeneration, unspecified cervical region: Secondary | ICD-10-CM | POA: Diagnosis not present

## 2016-08-23 ENCOUNTER — Encounter (INDEPENDENT_AMBULATORY_CARE_PROVIDER_SITE_OTHER): Payer: Self-pay | Admitting: *Deleted

## 2016-08-23 ENCOUNTER — Encounter (INDEPENDENT_AMBULATORY_CARE_PROVIDER_SITE_OTHER): Payer: Self-pay

## 2017-03-10 DIAGNOSIS — R229 Localized swelling, mass and lump, unspecified: Secondary | ICD-10-CM | POA: Diagnosis not present

## 2017-03-10 DIAGNOSIS — T782XXA Anaphylactic shock, unspecified, initial encounter: Secondary | ICD-10-CM | POA: Diagnosis not present

## 2017-03-10 DIAGNOSIS — K219 Gastro-esophageal reflux disease without esophagitis: Secondary | ICD-10-CM | POA: Diagnosis not present

## 2017-03-10 DIAGNOSIS — Z7982 Long term (current) use of aspirin: Secondary | ICD-10-CM | POA: Diagnosis not present

## 2017-03-10 DIAGNOSIS — G8929 Other chronic pain: Secondary | ICD-10-CM | POA: Diagnosis not present

## 2017-03-10 DIAGNOSIS — Z79899 Other long term (current) drug therapy: Secondary | ICD-10-CM | POA: Diagnosis not present

## 2017-03-10 DIAGNOSIS — R21 Rash and other nonspecific skin eruption: Secondary | ICD-10-CM | POA: Diagnosis not present

## 2017-03-10 DIAGNOSIS — T7840XA Allergy, unspecified, initial encounter: Secondary | ICD-10-CM | POA: Diagnosis not present

## 2017-03-10 DIAGNOSIS — I10 Essential (primary) hypertension: Secondary | ICD-10-CM | POA: Diagnosis not present

## 2017-03-28 DIAGNOSIS — Z6829 Body mass index (BMI) 29.0-29.9, adult: Secondary | ICD-10-CM | POA: Diagnosis not present

## 2017-03-28 DIAGNOSIS — I10 Essential (primary) hypertension: Secondary | ICD-10-CM | POA: Diagnosis not present

## 2017-03-28 DIAGNOSIS — E785 Hyperlipidemia, unspecified: Secondary | ICD-10-CM | POA: Diagnosis not present

## 2017-03-31 ENCOUNTER — Other Ambulatory Visit (INDEPENDENT_AMBULATORY_CARE_PROVIDER_SITE_OTHER): Payer: Self-pay | Admitting: *Deleted

## 2017-03-31 DIAGNOSIS — Z1211 Encounter for screening for malignant neoplasm of colon: Secondary | ICD-10-CM

## 2017-04-09 ENCOUNTER — Ambulatory Visit (INDEPENDENT_AMBULATORY_CARE_PROVIDER_SITE_OTHER): Payer: Medicare Other | Admitting: Allergy

## 2017-04-09 ENCOUNTER — Encounter: Payer: Self-pay | Admitting: Allergy

## 2017-04-09 VITALS — BP 120/70 | HR 70 | Temp 97.8°F | Resp 16 | Ht 66.54 in | Wt 192.6 lb

## 2017-04-09 DIAGNOSIS — T7840XD Allergy, unspecified, subsequent encounter: Secondary | ICD-10-CM | POA: Diagnosis not present

## 2017-04-09 NOTE — Progress Notes (Signed)
New Patient Note  RE: Jonathan Gibson MRN: [id number] DOB: [birth year] Date of Office Visit: 04/09/2017  Referring provider: Celedonio Savage, MD  - killed Primary care provider: Celene Squibb, MD  Chief Complaint: allergic reaction  History of present illness: Jonathan Gibson is a 66 y.o. male presenting today for consultation for allergic reaction.    A week before May 20th while showering he reports he broke out in hives.  On May 20th while in church his bottom lip started swelling.  He reports swelling wasn't that noticeable but he could feel it.  Monday early morning he woke up feeling like he had indigestion.  Later that morning his lip started swelling again and he had voice changes.  He went to [facility] where he received epinephrine shot and he reports his BP dropped.  EMS was called and he was stabilized and he was transferred to [facility] where he reports he received another epinephrine via IV and he was monitored for about 2 hours and able to discharge home.  He has an epipen.    He reports he has been eating foods with red meat including hamburger and pizza but does not recall if he ate these foods on the day of the reaction.     He had a recent tick bite about 6 or so weeks ago.   He does report he lost about 25lbs earlier this year and was able to come off his Cozaar. He was not on Cozaar that the time of the reaction.    About 20 years ago he had issues with hives and was seen in this practice at that time for evaluation. He denies a history of environmental allergies, eczema or previous food allergy concerns.   Review of systems: Review of Systems  Constitutional: Negative for chills, fever and malaise/fatigue.  HENT: Negative for congestion, ear discharge, ear pain, nosebleeds, sinus pain, sore throat and tinnitus.   Eyes: Negative for discharge and redness.  Respiratory: Negative for cough, shortness of breath and wheezing.   Cardiovascular: Positive for chest  pain.  Gastrointestinal: Positive for heartburn. Negative for abdominal pain, constipation, diarrhea, nausea and vomiting.  Musculoskeletal: Negative for joint pain and myalgias.  Skin: Positive for itching and rash.  Neurological: Negative for headaches.    All other systems negative unless noted above in HPI  Past medical history: Past Medical History:  Diagnosis Date  . Anxiety   . Heart murmur   . Hyperlipidemia   . Hypertension   . Nephrolithiasis   . Peripheral neuropathy     Past surgical history: Past Surgical History:  Procedure Laterality Date  . CERVICAL DISCECTOMY    . dermatofibrosarcoma      Family history:  Family History  Problem Relation Age of Onset  . Hypertension Father   . Heart disease Father   . Allergic rhinitis Neg Hx   . Angioedema Neg Hx   . Asthma Neg Hx   . Atopy Neg Hx   . Eczema Neg Hx   . Immunodeficiency Neg Hx   . Urticaria Neg Hx     Social history: He lives in a home without carpeting with wood heating and central cooling. There are dogs in the home. There is no concern for water damage, mildew or roaches in the home. He works as a Horticulturist, commercial. . Smoking status: Former Smoker    Quit date: 08/24/1994  . Smokeless tobacco: Never Used  . Alcohol use Not on file  Medication List: Allergies as of 04/09/2017      Reactions   Ace Inhibitors    REACTION: cough   Aspirin    REACTION: GI upset   Gemfibrozil    REACTION: muscle aches   Statins    REACTION: myalgia      Medication List       Accurate as of 04/09/17  3:32 PM. Always use your most recent med list.          aspirin 81 MG chewable tablet Chew 81 mg by mouth.   EPINEPHrine 0.3 mg/0.3 mL Soaj injection Commonly known as:  EPI-PEN INJECT INTRAMUSCULARLY AS DIRECTED   gabapentin 600 MG tablet Commonly known as:  NEURONTIN Take 600 mg by mouth daily.   losartan 50 MG tablet Commonly known as:  COZAAR Take 50 mg by mouth daily.   omeprazole 20 MG  capsule Commonly known as:  PRILOSEC Take 20 mg by mouth.   pravastatin 40 MG tablet Commonly known as:  PRAVACHOL TAKE 1 TABLET DAILY AT BEDTIME   traZODone 100 MG tablet Commonly known as:  DESYREL Take 50 mg by mouth 3 (three) times daily.       Known medication allergies: Allergies  Allergen Reactions  . Ace Inhibitors     REACTION: cough  . Aspirin     REACTION: GI upset  . Gemfibrozil     REACTION: muscle aches  . Statins     REACTION: myalgia     Physical examination: Blood pressure 120/70, pulse 70, temperature 97.8 F (36.6 C), temperature source Oral, resp. rate 16, height 5' 6.53" (1.69 m), weight 192 lb 9.6 oz (87.4 kg), SpO2 96 %.  General: Alert, interactive, in no acute distress. HEENT: PERRLA, TMs pearly gray, turbinates minimally edematous without discharge, post-pharynx non erythematous. Neck: Supple without lymphadenopathy. Lungs: Clear to auscultation without wheezing, rhonchi or rales. {no increased work of breathing. CV: Normal S1, S2 without murmurs. Abdomen: Nondistended, nontender. Skin: Warm and dry, without lesions or rashes. Extremities:  No clubbing, cyanosis or edema. Neuro:   Grossly intact.  Diagnositics/Labs: None today   Assessment and plan:   Allergic reaction    - recent reaction is concerning for alpha-gal allergy (red meat allergy) with recent tick bite and likely red meat ingestion.  Agree at this time with avoiding all red meat in the diet.      - will obtain following labs: alpha-gal panel, tryptase, environmental panel, CBC, CMP and TSH.       - have access to Epipen in case of another reaction.  Follow emergency action plan.      - we will call you with your results.    Follow-up 1 year or sooner if needed.  Let us know if you have any further allergic reactions.    I appreciate the opportunity to take part in Fed's care. Please do not hesitate to contact me with questions.  Sincerely,   Prudy Feeler,  MD Allergy/Immunology [facility]

## 2017-04-09 NOTE — Patient Instructions (Addendum)
Allergic reaction    - recent reaction is concerning for alpha-gal allergy (red meat allergy) with recent tick bite.  Agree at this time with avoiding all red meat in the diet.      - will obtain following labs: alpha-gal panel, tryptase, environmental panel, CBC, CMP and TSH.       - have access to Epipen in case of another reaction.  Follow emergency action plan.      - we will call you with your results.    Follow-up 1 year or sooner if needed.  Let us know if you have any further allergic reactions.

## 2017-04-16 DIAGNOSIS — S30860A Insect bite (nonvenomous) of lower back and pelvis, initial encounter: Secondary | ICD-10-CM | POA: Diagnosis not present

## 2017-04-16 DIAGNOSIS — T7840XD Allergy, unspecified, subsequent encounter: Secondary | ICD-10-CM | POA: Diagnosis not present

## 2017-04-16 DIAGNOSIS — I10 Essential (primary) hypertension: Secondary | ICD-10-CM | POA: Diagnosis not present

## 2017-04-16 DIAGNOSIS — E785 Hyperlipidemia, unspecified: Secondary | ICD-10-CM | POA: Diagnosis not present

## 2017-04-16 DIAGNOSIS — R7301 Impaired fasting glucose: Secondary | ICD-10-CM | POA: Diagnosis not present

## 2017-04-18 DIAGNOSIS — E785 Hyperlipidemia, unspecified: Secondary | ICD-10-CM | POA: Diagnosis not present

## 2017-04-18 DIAGNOSIS — I10 Essential (primary) hypertension: Secondary | ICD-10-CM | POA: Diagnosis not present

## 2017-04-18 DIAGNOSIS — R7301 Impaired fasting glucose: Secondary | ICD-10-CM | POA: Diagnosis not present

## 2017-04-18 DIAGNOSIS — Z0000 Encounter for general adult medical examination without abnormal findings: Secondary | ICD-10-CM | POA: Diagnosis not present

## 2017-04-22 ENCOUNTER — Telehealth (INDEPENDENT_AMBULATORY_CARE_PROVIDER_SITE_OTHER): Payer: Self-pay | Admitting: *Deleted

## 2017-04-22 ENCOUNTER — Encounter (INDEPENDENT_AMBULATORY_CARE_PROVIDER_SITE_OTHER): Payer: Self-pay | Admitting: *Deleted

## 2017-04-22 DIAGNOSIS — Z1211 Encounter for screening for malignant neoplasm of colon: Secondary | ICD-10-CM

## 2017-04-22 MED ORDER — PEG 3350-KCL-NA BICARB-NACL 420 G PO SOLR: 4000.0000 mL | Freq: Once | ORAL | 0 refills | Status: AC

## 2017-04-22 NOTE — Telephone Encounter (Signed)
Patient needs trilyte -- screening

## 2017-04-30 ENCOUNTER — Telehealth: Payer: Self-pay

## 2017-04-30 NOTE — Telephone Encounter (Signed)
Patient called and asked about lab results. Patients labs has not came back yet. I informed patient that we would call when the results come in.

## 2017-05-09 ENCOUNTER — Telehealth (INDEPENDENT_AMBULATORY_CARE_PROVIDER_SITE_OTHER): Payer: Self-pay | Admitting: *Deleted

## 2017-05-09 NOTE — Telephone Encounter (Signed)
Referring MD/PCP: hall   Procedure: tcs  Reason/Indication:  screening  Has patient had this procedure before?  Yes, over 10 yrs ago  If so, when, by whom and where?    Is there a family history of colon cancer?  no  Who?  What age when diagnosed?    Is patient diabetic?   no      Does patient have prosthetic heart valve or mechanical valve?  no  Do you have a pacemaker?  no  Has patient ever had endocarditis? no  Has patient had joint replacement within last 12 months?  no  Does patient tend to be constipated or take laxatives? no  Does patient have a history of alcohol/drug use?  no  Is patient on Coumadin, Plavix and/or Aspirin? yes  Medications: asa 81 mg daily, trazadone 150 mg daily, losartan 100 mg dily, gabapentin 800 mg daily, pravastatin 40 mg daily  Allergies: nkda  Medication Adjustment per Dr Laural Golden: asa 2 days  Procedure date & time: 06/05/17 at 1030

## 2017-05-12 NOTE — Telephone Encounter (Signed)
agree

## 2017-05-15 DIAGNOSIS — Z79899 Other long term (current) drug therapy: Secondary | ICD-10-CM | POA: Diagnosis not present

## 2017-05-15 DIAGNOSIS — K219 Gastro-esophageal reflux disease without esophagitis: Secondary | ICD-10-CM | POA: Diagnosis not present

## 2017-05-15 DIAGNOSIS — R21 Rash and other nonspecific skin eruption: Secondary | ICD-10-CM | POA: Diagnosis not present

## 2017-05-15 DIAGNOSIS — X58XXXA Exposure to other specified factors, initial encounter: Secondary | ICD-10-CM | POA: Diagnosis not present

## 2017-05-15 DIAGNOSIS — Y999 Unspecified external cause status: Secondary | ICD-10-CM | POA: Diagnosis not present

## 2017-05-15 DIAGNOSIS — Z743 Need for continuous supervision: Secondary | ICD-10-CM | POA: Diagnosis not present

## 2017-05-15 DIAGNOSIS — T7840XA Allergy, unspecified, initial encounter: Secondary | ICD-10-CM | POA: Diagnosis not present

## 2017-05-15 DIAGNOSIS — I10 Essential (primary) hypertension: Secondary | ICD-10-CM | POA: Diagnosis not present

## 2017-05-15 DIAGNOSIS — R220 Localized swelling, mass and lump, head: Secondary | ICD-10-CM | POA: Diagnosis not present

## 2017-05-15 DIAGNOSIS — Z7982 Long term (current) use of aspirin: Secondary | ICD-10-CM | POA: Diagnosis not present

## 2017-05-15 DIAGNOSIS — R279 Unspecified lack of coordination: Secondary | ICD-10-CM | POA: Diagnosis not present

## 2017-05-15 DIAGNOSIS — T783XXA Angioneurotic edema, initial encounter: Secondary | ICD-10-CM | POA: Diagnosis not present

## 2017-05-19 ENCOUNTER — Telehealth: Payer: Self-pay | Admitting: Allergy

## 2017-05-19 NOTE — Telephone Encounter (Addendum)
Advised pt's spouse that his labs were never sent here. I called [facility] and they only resulted 3 out of 5 labs. He did not have his alpha gal or tryptase performed. Advised pt's spouse that he would need to get those re-drawn for results. She then stated he may have went to his PCP for his lab draw. Advised her to call his PCP to see if there are any other results. Gave our fax number for PCP to fax if they do have them.   I also called [facility] to get his results faxed to [place].

## 2017-05-19 NOTE — Telephone Encounter (Signed)
Pt wife called and said that they have not heard any thing about the blood test. [phone].

## 2017-05-20 DIAGNOSIS — T7840XA Allergy, unspecified, initial encounter: Secondary | ICD-10-CM | POA: Diagnosis not present

## 2017-05-26 ENCOUNTER — Encounter (HOSPITAL_COMMUNITY): Payer: Self-pay | Admitting: Emergency Medicine

## 2017-05-26 ENCOUNTER — Emergency Department (HOSPITAL_COMMUNITY)
Admission: EM | Admit: 2017-05-26 | Discharge: 2017-05-26 | Disposition: A | Discharge status: Home / Self Care | Payer: Medicare Other | Attending: Emergency Medicine | Admitting: Emergency Medicine

## 2017-05-26 DIAGNOSIS — I10 Essential (primary) hypertension: Secondary | ICD-10-CM | POA: Diagnosis not present

## 2017-05-26 DIAGNOSIS — R21 Rash and other nonspecific skin eruption: Secondary | ICD-10-CM | POA: Insufficient documentation

## 2017-05-26 DIAGNOSIS — R600 Localized edema: Secondary | ICD-10-CM | POA: Insufficient documentation

## 2017-05-26 DIAGNOSIS — Y999 Unspecified external cause status: Secondary | ICD-10-CM | POA: Diagnosis not present

## 2017-05-26 DIAGNOSIS — T7840XA Allergy, unspecified, initial encounter: Secondary | ICD-10-CM | POA: Insufficient documentation

## 2017-05-26 DIAGNOSIS — Y929 Unspecified place or not applicable: Secondary | ICD-10-CM | POA: Insufficient documentation

## 2017-05-26 DIAGNOSIS — Y939 Activity, unspecified: Secondary | ICD-10-CM | POA: Diagnosis not present

## 2017-05-26 DIAGNOSIS — Z79899 Other long term (current) drug therapy: Secondary | ICD-10-CM | POA: Diagnosis not present

## 2017-05-26 DIAGNOSIS — Z7982 Long term (current) use of aspirin: Secondary | ICD-10-CM | POA: Diagnosis not present

## 2017-05-26 DIAGNOSIS — Z87891 Personal history of nicotine dependence: Secondary | ICD-10-CM | POA: Insufficient documentation

## 2017-05-26 DIAGNOSIS — Y33XXXA Other specified events, undetermined intent, initial encounter: Secondary | ICD-10-CM | POA: Diagnosis not present

## 2017-05-26 LAB — BASIC METABOLIC PANEL
ANION GAP: 9 (ref 5–15)
BUN: 10 mg/dL (ref 6–20)
CHLORIDE: 103 mmol/L (ref 101–111)
CO2: 27 mmol/L (ref 22–32)
Calcium: 9.3 mg/dL (ref 8.9–10.3)
Creatinine, Ser: 1.43 mg/dL — ABNORMAL HIGH (ref 0.61–1.24)
GFR calc Af Amer: 57 mL/min — ABNORMAL LOW (ref >60)
GFR calc non Af Amer: 49 mL/min — ABNORMAL LOW (ref >60)
GLUCOSE: 121 mg/dL — AB (ref 65–99)
POTASSIUM: 4.1 mmol/L (ref 3.5–5.1)
SODIUM: 139 mmol/L (ref 135–145)

## 2017-05-26 LAB — CBC WITH DIFFERENTIAL/PLATELET
BASOS ABS: 0.1 10*3/uL (ref 0.0–0.1)
BASOS PCT: 1 %
EOS ABS: 0.9 10*3/uL — AB (ref 0.0–0.7)
EOS PCT: 9 %
HEMATOCRIT: 47.3 % (ref 39.0–52.0)
Hemoglobin: 16 g/dL (ref 13.0–17.0)
Lymphocytes Relative: 22 %
Lymphs Abs: 2.3 10*3/uL (ref 0.7–4.0)
MCH: 29.5 pg (ref 26.0–34.0)
MCHC: 33.8 g/dL (ref 30.0–36.0)
MCV: 87.3 fL (ref 78.0–100.0)
MONO ABS: 0.8 10*3/uL (ref 0.1–1.0)
MONOS PCT: 7 %
NEUTROS ABS: 6.2 10*3/uL (ref 1.7–7.7)
Neutrophils Relative %: 61 %
Platelets: 166 10*3/uL (ref 150–400)
RBC: 5.42 MIL/uL (ref 4.22–5.81)
RDW: 13.9 % (ref 11.5–15.5)
WBC: 10.2 10*3/uL (ref 4.0–10.5)

## 2017-05-26 MED ORDER — FAMOTIDINE 20 MG PO TABS
20.0000 mg | ORAL_TABLET | Freq: Once | ORAL | Status: DC
  Filled 2017-05-26: qty 1

## 2017-05-26 MED ORDER — FAMOTIDINE IN NACL 20-0.9 MG/50ML-% IV SOLN
20.0000 mg | Freq: Once | INTRAVENOUS | Status: AC
  Administered 2017-05-26: 20 mg via INTRAVENOUS
  Filled 2017-05-26: qty 50

## 2017-05-26 MED ORDER — METHYLPREDNISOLONE 4 MG PO TBPK: ORAL_TABLET | ORAL | 0 refills | Status: DC

## 2017-05-26 MED ORDER — DIPHENHYDRAMINE HCL 50 MG/ML IJ SOLN
25.0000 mg | Freq: Once | INTRAMUSCULAR | Status: AC
  Administered 2017-05-26: 25 mg via INTRAVENOUS
  Filled 2017-05-26: qty 1

## 2017-05-26 MED ORDER — METHYLPREDNISOLONE SODIUM SUCC 125 MG IJ SOLR
125.0000 mg | Freq: Once | INTRAMUSCULAR | Status: AC
  Administered 2017-05-26: 125 mg via INTRAVENOUS
  Filled 2017-05-26: qty 2

## 2017-05-26 NOTE — ED Notes (Signed)
Pt denies CP, SOB, throat tightening, or difficulty swallowing. Noted redness and pt states pruritis to bilateral neck.

## 2017-05-26 NOTE — ED Triage Notes (Signed)
Pt reports lower lip swelling/hives since last night with hoarse voice today. Pt states he has had similar symptoms intermittently since May.

## 2017-05-26 NOTE — ED Provider Notes (Signed)
[facility] DEPT Provider Note   CSN: [id number] Arrival date & time: 05/26/17  0901     History   Chief Complaint Chief Complaint  Patient presents with  . Allergic Reaction    HPI Jonathan Gibson is a 66 y.o. male.  Pt presents to the ED today with an allergic reaction.  He said sx started last night with hives and swelling to his lips.  He also has hoarseness to his voice.  He has a hx of similar sx that have been intermittent since May.  Pt has seen Dr. Nelva Bush (allergy) who ordered alpha-gal panel, tryptase, and an environmental panel.  The pt said that he has not gotten the results of these yet.  When sx started last night, he took benadryl.  He last took benadryl around 0630 this morning.  He does have an epi pen, but did not take it.        Past Medical History:  Diagnosis Date  . Anxiety   . Heart murmur   . Hyperlipidemia   . Hypertension   . Nephrolithiasis   . Peripheral neuropathy     Patient Active Problem List   Diagnosis Date Noted  . Special screening for malignant neoplasms, colon 03/31/2017  . Palpitations 08/24/2014  . Coronary atherosclerosis 06/04/2010  . Hyperlipidemia 06/01/2010  . Essential hypertension 06/01/2010  . ESOPHAGEAL REFLUX 06/01/2010  . UNSPECIFIED RENAL FAILURE 06/01/2010  . DEGENERATION INTERVERTEBRAL DISC SITE UNSPEC 06/01/2010  . INSOMNIA UNSPECIFIED 06/01/2010    Past Surgical History:  Procedure Laterality Date  . CERVICAL DISCECTOMY    . dermatofibrosarcoma         Home Medications    Prior to Admission medications   Medication Sig Start Date End Date Taking? Authorizing Provider  aspirin 81 MG chewable tablet Chew 81 mg by mouth.   Yes [provider]  diphenhydrAMINE (BENADRYL) 25 MG tablet Take 50 mg by mouth every 4 (four) hours as needed for allergies.   Yes [provider]  EPINEPHrine 0.3 mg/0.3 mL IJ SOAJ injection INJECT INTRAMUSCULARLY AS DIRECTED 03/10/17  Yes [provider]  gabapentin (NEURONTIN) 800 MG tablet Take 400 mg by mouth daily.   Yes [provider]  naproxen sodium (ANAPROX) 220 MG tablet Take 220 mg by mouth daily as needed (pain).   Yes [provider]  traZODone (DESYREL) 100 MG tablet Take 150 mg by mouth at bedtime.    Yes [provider]  methylPREDNISolone (MEDROL DOSEPAK) 4 MG TBPK tablet Day 1: 8 mg PO before breakfast, 4 mg after lunch and after dinner, and 8 mg at bedtime  Day 2: 4 mg PO before breakfast, after lunch, and after dinner and 8 mg at bedtime  Day 3: 4 mg PO before breakfast, after lunch, after dinner, and at bedtime  Day 4: 4 mg PO before breakfast, after lunch, and at bedtime  Day 5: 4 mg PO before breakfast and at bedtime  Day 6: 4 mg PO before breakfast 05/26/17   Isla Pence, MD    Family History Family History  Problem Relation Age of Onset  . Hypertension Father   . Heart disease Father   . Allergic rhinitis Neg Hx   . Angioedema Neg Hx   . Asthma Neg Hx   . Atopy Neg Hx   . Eczema Neg Hx   . Immunodeficiency Neg Hx   . Urticaria Neg Hx     Social History Social History  Substance Use Topics  .  Smoking status: Former Smoker    Quit date: 08/24/1994  . Smokeless tobacco: Never Used  . Alcohol use No     Allergies   Prednisone; Ace inhibitors; Aspirin; Gemfibrozil; and Statins   Review of Systems Review of Systems  HENT: Positive for facial swelling.   Skin: Positive for rash.  All other systems reviewed and are negative.    Physical Exam Updated Vital Signs BP 134/84   Pulse 76   Temp 98.1 F (36.7 C)   Resp 15   Ht 5\' 7"  (1.702 m)   Wt 83.9 kg (185 lb)   SpO2 97%   BMI 28.98 kg/m   Physical Exam  Constitutional: He is oriented to person, place, and time. He appears well-developed and well-nourished.  HENT:  Head: Normocephalic and atraumatic.  Right Ear: External ear normal.  Left Ear: External ear normal.  Nose: Nose normal.    Mouth/Throat: Oropharynx is clear and moist.  Eyes: Pupils are equal, round, and reactive to light. Conjunctivae and EOM are normal.  Neck: Normal range of motion. Neck supple.  Cardiovascular: Normal rate, regular rhythm, normal heart sounds and intact distal pulses.   Pulmonary/Chest: Effort normal and breath sounds normal.  Abdominal: Soft. Bowel sounds are normal.  Musculoskeletal: Normal range of motion.  Neurological: He is alert and oriented to person, place, and time.  Skin: Skin is warm.  Scattered hives  Psychiatric: He has a normal mood and affect. His behavior is normal. Judgment and thought content normal.  Nursing note and vitals reviewed.    ED Treatments / Results  Labs (all labs ordered are listed, but only abnormal results are displayed) Labs Reviewed  CBC WITH DIFFERENTIAL/PLATELET - Abnormal; Notable for the following:       Result Value   Eosinophils Absolute 0.9 (*)    All other components within normal limits  BASIC METABOLIC PANEL - Abnormal; Notable for the following:    Glucose, Bld 121 (*)    Creatinine, Ser 1.43 (*)    GFR calc non Af Amer 49 (*)    GFR calc Af Amer 57 (*)    All other components within normal limits    EKG  EKG Interpretation None       Radiology No results found.  Procedures Procedures (including critical care time)  Medications Ordered in ED Medications  diphenhydrAMINE (BENADRYL) injection 25 mg (25 mg Intravenous Given 05/26/17 0921)  methylPREDNISolone sodium succinate (SOLU-MEDROL) 125 mg/2 mL injection 125 mg (125 mg Intravenous Given 05/26/17 0921)  famotidine (PEPCID) IVPB 20 mg premix (0 mg Intravenous Stopped 05/26/17 1000)     Initial Impression / Assessment and Plan / ED Course  I have reviewed the triage vital signs and the nursing notes.  Pertinent labs & imaging results that were available during my care of the patient were reviewed by me and considered in my medical decision making (see chart for  details).    Pt is feeling much better.  His blood pressure has been stable.  No signs of anaphylaxis.  He was observed for 3 hours and looks good.  He is stable for d/c.  He had hives with prednisone, but did fine with solumedrol, so I gave him a rx for medrol dose pack.  He knows to f/u with pcp and with allergy.  Final Clinical Impressions(s) / ED Diagnoses   Final diagnoses:  Allergic reaction, initial encounter    New Prescriptions Discharge Medication List as of 05/26/2017 12:01 PM    START  taking these medications   Details  methylPREDNISolone (MEDROL DOSEPAK) 4 MG TBPK tablet Day 1: 8 mg PO before breakfast, 4 mg after lunch and after dinner, and 8 mg at bedtime  Day 2: 4 mg PO before breakfast, after lunch, and after dinner and 8 mg at bedtime  Day 3: 4 mg PO before breakfast, after lunch, after dinner, and at bedtime   Day 4: 4 mg PO before breakfast, after lunch, and at bedtime  Day 5: 4 mg PO before breakfast and at bedtime  Day 6: 4 mg PO before breakfast, Print         Isla Pence, MD 05/26/17 [phone]

## 2017-05-28 DIAGNOSIS — Z6829 Body mass index (BMI) 29.0-29.9, adult: Secondary | ICD-10-CM | POA: Diagnosis not present

## 2017-05-28 DIAGNOSIS — T7840XA Allergy, unspecified, initial encounter: Secondary | ICD-10-CM | POA: Diagnosis not present

## 2017-06-05 ENCOUNTER — Encounter (HOSPITAL_COMMUNITY): Payer: Self-pay

## 2017-06-05 ENCOUNTER — Ambulatory Visit (HOSPITAL_COMMUNITY)
Admission: RE | Admit: 2017-06-05 | Discharge: 2017-06-05 | Disposition: A | Discharge status: Home / Self Care | Payer: Medicare Other | Source: Ambulatory Visit | Attending: Internal Medicine | Admitting: Internal Medicine

## 2017-06-05 ENCOUNTER — Telehealth: Payer: Self-pay | Admitting: Allergy

## 2017-06-05 ENCOUNTER — Encounter (HOSPITAL_COMMUNITY)
Admission: RE | Disposition: A | Discharge status: Home / Self Care | Payer: Self-pay | Source: Ambulatory Visit | Attending: Internal Medicine

## 2017-06-05 DIAGNOSIS — K5730 Diverticulosis of large intestine without perforation or abscess without bleeding: Secondary | ICD-10-CM | POA: Insufficient documentation

## 2017-06-05 DIAGNOSIS — Z79899 Other long term (current) drug therapy: Secondary | ICD-10-CM | POA: Diagnosis not present

## 2017-06-05 DIAGNOSIS — R011 Cardiac murmur, unspecified: Secondary | ICD-10-CM | POA: Diagnosis not present

## 2017-06-05 DIAGNOSIS — E785 Hyperlipidemia, unspecified: Secondary | ICD-10-CM | POA: Insufficient documentation

## 2017-06-05 DIAGNOSIS — Z859 Personal history of malignant neoplasm, unspecified: Secondary | ICD-10-CM | POA: Insufficient documentation

## 2017-06-05 DIAGNOSIS — Z7982 Long term (current) use of aspirin: Secondary | ICD-10-CM | POA: Diagnosis not present

## 2017-06-05 DIAGNOSIS — Z87891 Personal history of nicotine dependence: Secondary | ICD-10-CM | POA: Diagnosis not present

## 2017-06-05 DIAGNOSIS — Z886 Allergy status to analgesic agent status: Secondary | ICD-10-CM | POA: Diagnosis not present

## 2017-06-05 DIAGNOSIS — Z1211 Encounter for screening for malignant neoplasm of colon: Secondary | ICD-10-CM | POA: Diagnosis not present

## 2017-06-05 DIAGNOSIS — I10 Essential (primary) hypertension: Secondary | ICD-10-CM | POA: Insufficient documentation

## 2017-06-05 DIAGNOSIS — K648 Other hemorrhoids: Secondary | ICD-10-CM | POA: Insufficient documentation

## 2017-06-05 DIAGNOSIS — F419 Anxiety disorder, unspecified: Secondary | ICD-10-CM | POA: Insufficient documentation

## 2017-06-05 DIAGNOSIS — Z888 Allergy status to other drugs, medicaments and biological substances status: Secondary | ICD-10-CM | POA: Diagnosis not present

## 2017-06-05 DIAGNOSIS — G629 Polyneuropathy, unspecified: Secondary | ICD-10-CM | POA: Insufficient documentation

## 2017-06-05 HISTORY — DX: Malignant (primary) neoplasm, unspecified: C80.1

## 2017-06-05 HISTORY — PX: COLONOSCOPY: SHX5424

## 2017-06-05 SURGERY — COLONOSCOPY
Anesthesia: Moderate Sedation

## 2017-06-05 MED ORDER — MEPERIDINE HCL 50 MG/ML IJ SOLN
INTRAMUSCULAR | Status: AC
  Filled 2017-06-05: qty 1

## 2017-06-05 MED ORDER — MEPERIDINE HCL 50 MG/ML IJ SOLN
INTRAMUSCULAR | Status: DC | PRN
  Administered 2017-06-05 (×2): 25 mg via INTRAVENOUS

## 2017-06-05 MED ORDER — SODIUM CHLORIDE 0.9 % IV SOLN
INTRAVENOUS | Status: DC
  Administered 2017-06-05: 10:00:00 via INTRAVENOUS

## 2017-06-05 MED ORDER — MIDAZOLAM HCL 5 MG/5ML IJ SOLN
INTRAMUSCULAR | Status: AC
  Filled 2017-06-05: qty 10

## 2017-06-05 MED ORDER — MIDAZOLAM HCL 5 MG/5ML IJ SOLN
INTRAMUSCULAR | Status: DC | PRN
  Administered 2017-06-05 (×2): 1 mg via INTRAVENOUS
  Administered 2017-06-05 (×2): 2 mg via INTRAVENOUS

## 2017-06-05 MED ORDER — SIMETHICONE 40 MG/0.6ML PO SUSP
ORAL | Status: DC | PRN
  Administered 2017-06-05: 11:00:00

## 2017-06-05 NOTE — Discharge Instructions (Signed)
Resume usual medications and high fiber diet. No driving for 24 hours. Next screening exam in 10 years.     Colonoscopy, Adult, Care After This sheet gives you information about how to care for yourself after your procedure. Your doctor may also give you more specific instructions. If you have problems or questions, call your doctor. Follow these instructions at home: General instructions   For the first 24 hours after the procedure: ? Do not drive or use machinery. ? Do not sign important documents. ? Do not drink alcohol. ? Do your daily activities more slowly than normal. ? Eat foods that are soft and easy to digest. ? Rest often.  Take over-the-counter or prescription medicines only as told by your doctor.  It is up to you to get the results of your procedure. Ask your doctor, or the department performing the procedure, when your results will be ready. To help cramping and bloating:  Try walking around.  Put heat on your belly (abdomen) as told by your doctor. Use a heat source that your doctor recommends, such as a moist heat pack or a heating pad. ? Put a towel between your skin and the heat source. ? Leave the heat on for 20-30 minutes. ? Remove the heat if your skin turns bright red. This is especially important if you cannot feel pain, heat, or cold. You can get burned. Eating and drinking  Drink enough fluid to keep your pee (urine) clear or pale yellow.  Return to your normal diet as told by your doctor. Avoid heavy or fried foods that are hard to digest.  Avoid drinking alcohol for as long as told by your doctor. Contact a doctor if:  You have blood in your poop (stool) 2-3 days after the procedure. Get help right away if:  You have more than a small amount of blood in your poop.  You see large clumps of tissue (blood clots) in your poop.  Your belly is swollen.  You feel sick to your stomach (nauseous).  You throw up (vomit).  You have a  fever.  You have belly pain that gets worse, and medicine does not help your pain. This information is not intended to replace advice given to you by your health care provider. Make sure you discuss any questions you have with your health care provider. Document Released: 11/09/2010 Document Revised: 07/01/2016 Document Reviewed: 07/01/2016 Elsevier Interactive Patient Education  2017 [employer]    Diverticulosis Diverticulosis is a condition that develops when small pouches (diverticula) form in the wall of the large intestine (colon). The colon is where water is absorbed and stool is formed. The pouches form when the inside layer of the colon pushes through weak spots in the outer layers of the colon. You may have a few pouches or many of them. What are the causes? The cause of this condition is not known. What increases the risk? The following factors may make you more likely to develop this condition:  Being older than age 85. Your risk for this condition increases with age. Diverticulosis is rare among people younger than age 76. By age 83, many people have it.  Eating a low-fiber diet.  Having frequent constipation.  Being overweight.  Not getting enough exercise.  Smoking.  Taking over-the-counter pain medicines, like aspirin and ibuprofen.  Having a family history of diverticulosis.  What are the signs or symptoms? In most people, there are no symptoms of this condition. If you do have symptoms,  they may include:  Bloating.  Cramps in the abdomen.  Constipation or diarrhea.  Pain in the lower left side of the abdomen.  How is this diagnosed? This condition is most often diagnosed during an exam for other colon problems. Because diverticulosis usually has no symptoms, it often cannot be diagnosed independently. This condition may be diagnosed by:  Using a flexible scope to examine the colon (colonoscopy).  Taking an X-ray of the colon after dye has been put  into the colon (barium enema).  Doing a CT scan.  How is this treated? You may not need treatment for this condition if you have never developed an infection related to diverticulosis. If you have had an infection before, treatment may include:  Eating a high-fiber diet. This may include eating more fruits, vegetables, and grains.  Taking a fiber supplement.  Taking a live bacteria supplement (probiotic).  Taking medicine to relax your colon.  Taking antibiotic medicines.  Follow these instructions at home:  Drink 6-8 glasses of water or more each day to prevent constipation.  Try not to strain when you have a bowel movement.  If you have had an infection before: ? Eat more fiber as directed by your health care provider or your diet and nutrition specialist (dietitian). ? Take a fiber supplement or probiotic, if your health care provider approves.  Take over-the-counter and prescription medicines only as told by your health care provider.  If you were prescribed an antibiotic, take it as told by your health care provider. Do not stop taking the antibiotic even if you start to feel better.  Keep all follow-up visits as told by your health care provider. This is important. Contact a health care provider if:  You have pain in your abdomen.  You have bloating.  You have cramps.  You have not had a bowel movement in 3 days. Get help right away if:  Your pain gets worse.  Your bloating becomes very bad.  You have a fever or chills, and your symptoms suddenly get worse.  You vomit.  You have bowel movements that are bloody or black.  You have bleeding from your rectum. Summary  Diverticulosis is a condition that develops when small pouches (diverticula) form in the wall of the large intestine (colon).  You may have a few pouches or many of them.  This condition is most often diagnosed during an exam for other colon problems.  If you have had an infection related  to diverticulosis, treatment may include increasing the fiber in your diet, taking supplements, or taking medicines. This information is not intended to replace advice given to you by your health care provider. Make sure you discuss any questions you have with your health care provider. Document Released: 07/04/2004 Document Revised: 08/26/2016 Document Reviewed: 08/26/2016 Elsevier Interactive Patient Education  2017 [place].     High-Fiber Diet Fiber, also called dietary fiber, is a type of carbohydrate found in fruits, vegetables, whole grains, and beans. A high-fiber diet can have many health benefits. Your health care provider may recommend a high-fiber diet to help: Prevent constipation. Fiber can make your bowel movements more regular. Lower your cholesterol. Relieve hemorrhoids, uncomplicated diverticulosis, or irritable bowel syndrome. Prevent overeating as part of a weight-loss plan. Prevent heart disease, type 2 diabetes, and certain cancers.  What is my plan? The recommended daily intake of fiber includes: 38 grams for men under age 21. 74 grams for men over age 32. 25 grams for women under  age 60. 62 grams for women over age 93.  You can get the recommended daily intake of dietary fiber by eating a variety of fruits, vegetables, grains, and beans. Your health care provider may also recommend a fiber supplement if it is not possible to get enough fiber through your diet. What do I need to know about a high-fiber diet? Fiber supplements have not been widely studied for their effectiveness, so it is better to get fiber through food sources. Always check the fiber content on thenutrition facts label of any prepackaged food. Look for foods that contain at least 5 grams of fiber per serving. Ask your dietitian if you have questions about specific foods that are related to your condition, especially if those foods are not listed in the following section. Increase your daily  fiber consumption gradually. Increasing your intake of dietary fiber too quickly may cause bloating, cramping, or gas. Drink plenty of water. Water helps you to digest fiber. What foods can I eat? Grains Whole-grain breads. Multigrain cereal. Oats and oatmeal. Brown rice. Barley. Bulgur wheat. [place]. Bran muffins. Popcorn. Rye wafer crackers. Vegetables Sweet potatoes. Spinach. Kale. Artichokes. Cabbage. Broccoli. Green peas. Carrots. Squash. Fruits Berries. Pears. Apples. Oranges. Avocados. Prunes and raisins. Dried figs. Meats and Other Protein Sources [place], kidney, pinto, and soy beans. Split peas. Lentils. Nuts and seeds. Dairy Fiber-fortified yogurt. Beverages Fiber-fortified soy milk. Fiber-fortified orange juice. Other Fiber bars. The items listed above may not be a complete list of recommended foods or beverages. Contact your dietitian for more options. What foods are not recommended? Grains White bread. Pasta made with refined flour. White rice. Vegetables Fried potatoes. Canned vegetables. Well-cooked vegetables. Fruits Fruit juice. Cooked, strained fruit. Meats and Other Protein Sources Fatty cuts of meat. Fried Sales executive or fried fish. Dairy Milk. Yogurt. Cream cheese. Sour cream. Beverages Soft drinks. Other Cakes and pastries. Butter and oils. The items listed above may not be a complete list of foods and beverages to avoid. Contact your dietitian for more information. What are some tips for including high-fiber foods in my diet? Eat a wide variety of high-fiber foods. Make sure that half of all grains consumed each day are whole grains. Replace breads and cereals made from refined flour or white flour with whole-grain breads and cereals. Replace white rice with brown rice, bulgur wheat, or millet. Start the day with a breakfast that is high in fiber, such as a cereal that contains at least 5 grams of fiber per serving. Use beans in place of meat in soups, salads,  or pasta. Eat high-fiber snacks, such as berries, raw vegetables, nuts, or popcorn. This information is not intended to replace advice given to you by your health care provider. Make sure you discuss any questions you have with your health care provider. Document Released: 10/07/2005 Document Revised: 03/14/2016 Document Reviewed: 03/22/2014 Elsevier Interactive Patient Education  2017 [employer].

## 2017-06-05 NOTE — H&P (Signed)
Jonathan Gibson is an 66 y.o. male.   Chief Complaint: Patient is here for colonoscopy. HPI: Patient is 66 year old Caucasian male was here for screening colonoscopy. Last exam was normal 10 years ago. He denies abdominal pain change in bowel habits or rectal bleeding. Family history is negative for CRC.  Past Medical History:  Diagnosis Date  . Anxiety   . Cancer ([place])   . Heart murmur   . Hyperlipidemia   . Hypertension   . Nephrolithiasis   . Peripheral neuropathy     Past Surgical History:  Procedure Laterality Date  . CERVICAL DISCECTOMY    . dermatofibrosarcoma      Family History  Problem Relation Age of Onset  . Hypertension Father   . Heart disease Father   . Allergic rhinitis Neg Hx   . Angioedema Neg Hx   . Asthma Neg Hx   . Atopy Neg Hx   . Eczema Neg Hx   . Immunodeficiency Neg Hx   . Urticaria Neg Hx    Social History:  reports that he quit smoking about 22 years ago. He has never used smokeless tobacco. He reports that he does not drink alcohol or use drugs.  Allergies:  Allergies  Allergen Reactions  . Prednisone Hives  . Ace Inhibitors     REACTION: cough  . Aspirin     REACTION: GI upset  . Gemfibrozil     REACTION: muscle aches  . Statins     REACTION: myalgia    Medications Prior to Admission  Medication Sig Dispense Refill  . aspirin 81 MG chewable tablet Chew 81 mg by mouth.    . diphenhydrAMINE (BENADRYL) 25 MG tablet Take 50 mg by mouth every 4 (four) hours as needed for allergies.    Marland Kitchen gabapentin (NEURONTIN) 800 MG tablet Take 400 mg by mouth daily.    . methylPREDNISolone (MEDROL DOSEPAK) 4 MG TBPK tablet Day 1: 8 mg PO before breakfast, 4 mg after lunch and after dinner, and 8 mg at bedtime  Day 2: 4 mg PO before breakfast, after lunch, and after dinner and 8 mg at bedtime  Day 3: 4 mg PO before breakfast, after lunch, after dinner, and at bedtime  Day 4: 4 mg PO before breakfast, after lunch, and at bedtime  Day 5: 4 mg PO  before breakfast and at bedtime  Day 6: 4 mg PO before breakfast 18 tablet 0  . naproxen sodium (ANAPROX) 220 MG tablet Take 220 mg by mouth daily as needed (pain).    . traZODone (DESYREL) 100 MG tablet Take 150 mg by mouth at bedtime.     Marland Kitchen EPINEPHrine 0.3 mg/0.3 mL IJ SOAJ injection INJECT INTRAMUSCULARLY AS DIRECTED  0    No results found for this or any previous visit (from the past 48 hour(s)). No results found.  ROS  Blood pressure 131/83, pulse 64, temperature 98.1 F (36.7 C), temperature source Oral, resp. rate 11, SpO2 95 %. Physical Exam  Constitutional: He is oriented to person, place, and time. He appears well-developed and well-nourished.  HENT:  Deep large scar at mid forehead.  Cardiovascular: Normal rate and regular rhythm.   Murmur (faint systolic ejection murmur heard at left sternal border and aortic area.) heard. Respiratory: Effort normal and breath sounds normal.  GI: Soft. He exhibits no distension and no mass. There is no tenderness.  Musculoskeletal: He exhibits no edema.  Neurological: He is alert and oriented to person, place, and time.  Skin:  Skin is warm and dry.     Assessment/Plan Average risk screening colonoscopy.  Hildred Laser, MD 06/05/2017, 10:44 AM

## 2017-06-05 NOTE — Telephone Encounter (Signed)
Dr Padgett please advise 

## 2017-06-05 NOTE — Telephone Encounter (Signed)
Patients wife has called back a third time requesting lab work results. Please call her at [phone]

## 2017-06-05 NOTE — Op Note (Signed)
[facility] Patient Name: Jonathan Gibson Procedure Date: 06/05/2017 10:28 AM MRN: [id number] Date of Birth: 09/02/1951 Attending MD: Hildred Laser , MD CSN: [id number] Age: 66 Admit Type: Outpatient Procedure:                Colonoscopy Indications:              Screening for colorectal malignant neoplasm Providers:                Hildred Laser, MD, Otis Peak B. Sharon Seller, RN, Rosina Lowenstein, RN Referring MD:             Edwinna Areola. Nevada Crane, MD Medicines:                Meperidine 50 mg IV, Midazolam 6 mg IV Complications:            No immediate complications. Estimated Blood Loss:     Estimated blood loss: none. Procedure:                Pre-Anesthesia Assessment:                           - Prior to the procedure, a History and Physical                            was performed, and patient medications and                            allergies were reviewed. The patient's tolerance of                            previous anesthesia was also reviewed. The risks                            and benefits of the procedure and the sedation                            options and risks were discussed with the patient.                            All questions were answered, and informed consent                            was obtained. Prior Anticoagulants: The patient                            last took aspirin 3 days prior to the procedure.                            ASA Grade Assessment: II - A patient with mild                            systemic disease. After reviewing the risks and  benefits, the patient was deemed in satisfactory                            condition to undergo the procedure.                           After obtaining informed consent, the colonoscope                            was passed under direct vision. Throughout the                            procedure, the patient's blood pressure, pulse, and                             oxygen saturations were monitored continuously. The                            EC-[id number] ([id number]) scope was introduced through                            the anus and advanced to the the cecum, identified                            by appendiceal orifice and ileocecal valve. The                            colonoscopy was performed without difficulty. The                            patient tolerated the procedure well. The quality                            of the bowel preparation was good. The ileocecal                            valve, appendiceal orifice, and rectum were                            photographed. Scope In: 10:53:57 AM Scope Out: 11:13:35 AM Scope Withdrawal Time: 0 hours 11 minutes 21 seconds  Total Procedure Duration: 0 hours 19 minutes 38 seconds  Findings:      The perianal and digital rectal examinations were normal.      Scattered medium-mouthed diverticula were found in the sigmoid colon.      The exam was otherwise normal throughout the examined colon.      Internal hemorrhoids were found during retroflexion. The hemorrhoids       were medium-sized. Impression:               - Diverticulosis in the sigmoid colon.                           - Internal hemorrhoids.                           -  No specimens collected. Moderate Sedation:      Moderate (conscious) sedation was administered by the endoscopy nurse       and supervised by the endoscopist. The following parameters were       monitored: oxygen saturation, heart rate, blood pressure, CO2       capnography and response to care. Total physician intraservice time was       24 minutes. Recommendation:           - Patient has a contact number available for                            emergencies. The signs and symptoms of potential                            delayed complications were discussed with the                            patient. Return to normal activities tomorrow.                             Written discharge instructions were provided to the                            patient.                           - High fiber diet today.                           - Continue present medications.                           - Repeat colonoscopy in 10 years for screening                            purposes. Procedure Code(s):        --- Professional ---                           [phone], Colonoscopy, flexible; diagnostic, including                            collection of specimen(s) by brushing or washing,                            when performed (separate procedure)                           99152, Moderate sedation services provided by the                            same physician or other qualified health care                            professional performing the diagnostic or  therapeutic service that the sedation supports,                            requiring the presence of an independent trained                            observer to assist in the monitoring of the                            patient's level of consciousness and physiological                            status; initial 15 minutes of intraservice time,                            patient age 35 years or older                           [id number], Moderate sedation services; each additional                            15 minutes intraservice time Diagnosis Code(s):        --- Professional ---                           Z12.11, Encounter for screening for malignant                            neoplasm of colon                           K64.8, Other hemorrhoids                           K57.30, Diverticulosis of large intestine without                            perforation or abscess without bleeding CPT copyright 2016 American Medical Association. All rights reserved. The codes documented in this report are preliminary and upon coder review may  be revised to meet current compliance  requirements. Hildred Laser, MD Hildred Laser, MD 06/05/2017 11:19:46 AM This report has been signed electronically. Number of Addenda: 0

## 2017-06-06 ENCOUNTER — Telehealth: Payer: Self-pay

## 2017-06-06 NOTE — Telephone Encounter (Signed)
I called the PCP 4 times with 3 different extensions did not get an answer and was told to leave a message. I also called [facility] and spoke to [place] and he stated that he had no orders or results in for this patient.

## 2017-06-06 NOTE — Telephone Encounter (Signed)
I had labs ordered at last visit.  He went to PCP to have done but not all were done.  I don't know if he had the additional labs done.  I have not received any lab results back not even the labs he did have done.    If we can not locate the labs he may just need to come here and have them redrawn to ensure we get the labs back.    We can check again with [facility] to see what they have and get that faxed otherwise I think we are stuck with having him redo the labs if we can not get a hold of the results.  I also do not think we received any labs from the PCP either.

## 2017-06-06 NOTE — Telephone Encounter (Signed)
Spoke to the wife and informed her that we still have not received any other lab results from his PCP. She stated that she has tried to call them but they have not returned her call. She stated that she will wait until his next appointment.

## 2017-06-06 NOTE — Telephone Encounter (Signed)
error 

## 2017-06-09 ENCOUNTER — Encounter (HOSPITAL_COMMUNITY): Payer: Self-pay | Admitting: Internal Medicine

## 2017-07-04 ENCOUNTER — Encounter: Payer: Self-pay | Admitting: Allergy

## 2017-07-04 ENCOUNTER — Ambulatory Visit (INDEPENDENT_AMBULATORY_CARE_PROVIDER_SITE_OTHER): Payer: Medicare Other | Admitting: Allergy

## 2017-07-04 VITALS — BP 130/68 | HR 58 | Temp 97.7°F | Resp 18

## 2017-07-04 DIAGNOSIS — J3089 Other allergic rhinitis: Secondary | ICD-10-CM

## 2017-07-04 DIAGNOSIS — T7840XD Allergy, unspecified, subsequent encounter: Secondary | ICD-10-CM

## 2017-07-04 NOTE — Progress Notes (Signed)
Follow-up Note  RE: Jonathan Gibson MRN: [id number] DOB: 06-05-1951 Date of Office Visit: 07/04/2017   History of present illness: Jonathan Gibson is a 66 y.o. male presenting today for follow-up of allergic reactions. He was last seen in the office on 04/09/2017 by myself for initial evaluation. Since this visit he has had several subsequent reactions.  He was seen in the emergency department on 05/15/2017 for angioedema of the lips after eating a sandwich from [place].   He has also had swelling after eating a chicken sandwich with concern for possible cross-contamination.  He was given epinephrine, Solu-Medrol and Benadryl with improvement in his symptoms.  He had another emergency department visit on 05/26/2017 for allergic reaction as he had symptoms of hives and lip swelling and a hoarse voice. He was treated with Benadryl, Solu-Medrol and Pepcid.  He was also discharged with a medrol dose pack.   After his initial visit with me I ordered several labs to help determine what could be the cause of his recurrent allergic reactions.  He apparently had his labs done at his PCPs office however I never received any results in the lab that it was performed at reports they do not have any results for him either.   He states since his last reaction he has been eating at home and only eating chicken, [place] and fish and has not had any further reactions.   He has access to his Epipen.    Review of systems: Review of Systems  Constitutional: Negative for chills, fever and malaise/fatigue.  HENT: Positive for congestion. Negative for ear discharge, ear pain, nosebleeds, sinus pain, sore throat and tinnitus.   Eyes: Negative for discharge and redness.  Respiratory: Negative for cough, shortness of breath and wheezing.   Cardiovascular: Negative for chest pain.  Gastrointestinal: Negative for abdominal pain, diarrhea, nausea and vomiting.  Musculoskeletal: Negative for joint pain.  Skin: Negative  for itching and rash.  Neurological: Negative for headaches.    All other systems negative unless noted above in HPI  Past medical/social/surgical/family history have been reviewed and are unchanged unless specifically indicated below.  No changes  Medication List: Allergies as of 07/04/2017      Reactions   Prednisone Hives   Ace Inhibitors    REACTION: cough   Aspirin    REACTION: GI upset   Gemfibrozil    REACTION: muscle aches   Statins    REACTION: myalgia      Medication List       Accurate as of 07/04/17 10:19 AM. Always use your most recent med list.          aspirin 81 MG chewable tablet Chew 81 mg by mouth.   diphenhydrAMINE 25 MG tablet Commonly known as:  BENADRYL Take 50 mg by mouth every 4 (four) hours as needed for allergies.   EPINEPHrine 0.3 mg/0.3 mL Soaj injection Commonly known as:  EPI-PEN INJECT INTRAMUSCULARLY AS DIRECTED   gabapentin 800 MG tablet Commonly known as:  NEURONTIN Take 400 mg by mouth daily.   naproxen sodium 220 MG tablet Commonly known as:  ANAPROX Take 220 mg by mouth daily as needed (pain).   traZODone 100 MG tablet Commonly known as:  DESYREL Take 150 mg by mouth at bedtime.       Known medication allergies: Allergies  Allergen Reactions  . Prednisone Hives  . Ace Inhibitors     REACTION: cough  . Aspirin     REACTION: GI  upset  . Gemfibrozil     REACTION: muscle aches  . Statins     REACTION: myalgia     Physical examination: Blood pressure 130/68, pulse (!) 58, temperature 97.7 F (36.5 C), temperature source Oral, resp. rate 18, SpO2 96 %.  General: Alert, interactive, in no acute distress. HEENT: PERRLA, TMs pearly gray, turbinates minimally edematous without discharge, post-pharynx non erythematous. Neck: Supple without lymphadenopathy. Lungs: Clear to auscultation without wheezing, rhonchi or rales. {no increased work of breathing. CV: Normal S1, S2 without murmurs. Abdomen: Nondistended,  nontender. Skin: Warm and dry, without lesions or rashes. Extremities:  No clubbing, cyanosis or edema. Neuro:   Grossly intact.  Diagnositics/Labs: Labs:  Alpha-gal 31.6  Assessment and plan:   Allergic reaction/Alpha-gal Allergy    - will plan to obtain repeat alpha-gal levels and tryptase around August 2019.      - have access to Epipen in case of another reaction.  Follow emergency action plan.      - start a daily antihistamine (Allegra 180mg  samples provided  Allergic rhinitis    - provided with Dymista sample which should help with nasal drainage/throat clearing.  Let us know if this if effective and will prescribe Astelin separately for you.      - daily antihistamine as above  Follow-up 1 year or sooner if needed.   I appreciate the opportunity to take part in Jihaad's care. Please do not hesitate to contact me with questions.  Sincerely,   Prudy Feeler, MD Allergy/Immunology [facility] [place] [facility]

## 2017-07-04 NOTE — Patient Instructions (Addendum)
Allergic reaction/Alpha-gal Allergy    - will plan to obtain repeat alpha-gal levels and tryptase around August 2019.      - have access to Epipen in case of another reaction.  Follow emergency action plan.      - start a daily antihistamine (Allegra 180mg  samples provided  Allergic rhinitis    - provided with Dymista sample which should help with nasal drainage/throat clearing.  Let us know if this if effective and will prescribe Astelin separately for you.      - daily antihistamine as above  Follow-up 1 year or sooner if needed.

## 2017-07-17 DIAGNOSIS — I10 Essential (primary) hypertension: Secondary | ICD-10-CM | POA: Diagnosis not present

## 2017-07-17 DIAGNOSIS — R7301 Impaired fasting glucose: Secondary | ICD-10-CM | POA: Diagnosis not present

## 2017-07-21 DIAGNOSIS — Z6829 Body mass index (BMI) 29.0-29.9, adult: Secondary | ICD-10-CM | POA: Diagnosis not present

## 2017-07-21 DIAGNOSIS — E785 Hyperlipidemia, unspecified: Secondary | ICD-10-CM | POA: Diagnosis not present

## 2017-07-21 DIAGNOSIS — R944 Abnormal results of kidney function studies: Secondary | ICD-10-CM | POA: Diagnosis not present

## 2017-07-21 DIAGNOSIS — R7309 Other abnormal glucose: Secondary | ICD-10-CM | POA: Diagnosis not present

## 2017-07-21 DIAGNOSIS — Z136 Encounter for screening for cardiovascular disorders: Secondary | ICD-10-CM | POA: Diagnosis not present

## 2017-07-21 DIAGNOSIS — I10 Essential (primary) hypertension: Secondary | ICD-10-CM | POA: Diagnosis not present

## 2017-07-24 ENCOUNTER — Other Ambulatory Visit (HOSPITAL_COMMUNITY): Payer: Self-pay | Admitting: Internal Medicine

## 2017-07-24 DIAGNOSIS — Z87891 Personal history of nicotine dependence: Secondary | ICD-10-CM

## 2017-07-31 ENCOUNTER — Ambulatory Visit (HOSPITAL_COMMUNITY)
Admission: RE | Admit: 2017-07-31 | Discharge: 2017-07-31 | Disposition: A | Discharge status: Home / Self Care | Payer: Medicare Other | Source: Ambulatory Visit | Attending: Internal Medicine | Admitting: Internal Medicine

## 2017-07-31 DIAGNOSIS — Z87891 Personal history of nicotine dependence: Secondary | ICD-10-CM | POA: Diagnosis not present

## 2017-07-31 DIAGNOSIS — Z136 Encounter for screening for cardiovascular disorders: Secondary | ICD-10-CM | POA: Diagnosis not present

## 2017-08-19 ENCOUNTER — Encounter (INDEPENDENT_AMBULATORY_CARE_PROVIDER_SITE_OTHER): Payer: Self-pay

## 2017-08-19 ENCOUNTER — Ambulatory Visit (INDEPENDENT_AMBULATORY_CARE_PROVIDER_SITE_OTHER): Payer: Medicare Other | Admitting: Family Medicine

## 2017-08-19 ENCOUNTER — Encounter: Payer: Self-pay | Admitting: Family Medicine

## 2017-08-19 VITALS — BP 138/82 | HR 72 | Temp 98.3°F | Resp 16 | Ht 67.0 in | Wt 189.1 lb

## 2017-08-19 DIAGNOSIS — R030 Elevated blood-pressure reading, without diagnosis of hypertension: Secondary | ICD-10-CM | POA: Diagnosis not present

## 2017-08-19 DIAGNOSIS — E785 Hyperlipidemia, unspecified: Secondary | ICD-10-CM

## 2017-08-19 DIAGNOSIS — Z91018 Allergy to other foods: Secondary | ICD-10-CM | POA: Diagnosis not present

## 2017-08-19 MED ORDER — GABAPENTIN 400 MG PO CAPS: 400.0000 mg | ORAL_CAPSULE | Freq: Two times a day (BID) | ORAL | 0 refills | Status: DC

## 2017-08-19 MED ORDER — TRAZODONE HCL 150 MG PO TABS: 150.0000 mg | ORAL_TABLET | Freq: Every day | ORAL | 3 refills | Status: DC

## 2017-08-19 NOTE — Progress Notes (Signed)
Chief Complaint  Patient presents with  . Hypertension   This is a new patient here for consultation. He has a past history of hypertension.  He is currently not taking any medications.  He gives a history of multiple tick bites followed by onset of allergic reactions.  Allergy workup revealed that he was allergic to red meat (alpha gal).  Since going on the low meat diet, he has reduced his carbohydrate and lost weight.  He states he no longer needs cholesterol medicine or blood pressure medication. Patient states he recently had blood work at his prior doctor's office.  He will bring me a copy for review. He is up-to-date with his health testing He refuses flu shot.  He states that his allergist told us there might be a cross reaction.  The flu shot does contain gelatin which is a meat product.  I will defer the flu shot for him. He has a history of esophageal reflux.  He is not on a medication for this, he watches his diet. He does have a history of chronic insomnia.  For this he takes trazodone 150 mg a day and gabapentin 400 mg a day at bedtime.  These worked successfully for him. He is retired and lives at home with his wife.  They have an adult mentally disabled son name Scotti.   Patient Active Problem List   Diagnosis Date Noted  . Allergy to meat 08/19/2017  . Coronary atherosclerosis 06/04/2010  . Hyperlipidemia 06/01/2010  . Essential hypertension 06/01/2010  . ESOPHAGEAL REFLUX 06/01/2010  . DEGENERATION INTERVERTEBRAL DISC SITE UNSPEC 06/01/2010  . INSOMNIA UNSPECIFIED 06/01/2010    Outpatient Encounter Prescriptions as of 08/19/2017  Medication Sig  . aspirin 81 MG chewable tablet Chew 81 mg by mouth.  . diphenhydrAMINE (BENADRYL) 25 MG tablet Take 50 mg by mouth every 4 (four) hours as needed for allergies.  Marland Kitchen EPINEPHrine 0.3 mg/0.3 mL IJ SOAJ injection INJECT INTRAMUSCULARLY AS DIRECTED  . fexofenadine (ALLEGRA) 180 MG tablet Take 180 mg by mouth daily.  . naproxen  sodium (ANAPROX) 220 MG tablet Take 220 mg by mouth daily as needed (pain).  . traZODone (DESYREL) 150 MG tablet Take 1 tablet (150 mg total) by mouth at bedtime.  . gabapentin (NEURONTIN) 400 MG capsule Take 1 capsule (400 mg total) by mouth 2 (two) times daily.   No facility-administered encounter medications on file as of 08/19/2017.     Past Medical History:  Diagnosis Date  . Allergy   . Anxiety   . Arthritis   . Cancer ([place])    skin  . Cataract   . GERD (gastroesophageal reflux disease)   . Heart murmur   . Hyperlipidemia   . Hypertension   . Nephrolithiasis   . Peripheral neuropathy     Past Surgical History:  Procedure Laterality Date  . CERVICAL DISCECTOMY    . COLONOSCOPY N/A 06/05/2017   Procedure: COLONOSCOPY;  Surgeon: Rogene Houston, MD;  Location: [facility];  Service: Endoscopy;  Laterality: N/A;  1030  . dermatofibrosarcoma    . SPINE SURGERY     neck and low back    Social History   Social History  . Marital status: Married    Spouse name: Olin Hauser  . Number of children: 2  . Years of education: 12   Occupational History  . retired    Social History Main Topics  . Smoking status: Former Smoker    Packs/day: 1.00    Types:  Cigarettes    Start date: 10/22/1963    Quit date: 08/24/1994  . Smokeless tobacco: Never Used  . Alcohol use No  . Drug use: No  . Sexual activity: Not Currently   Other Topics Concern  . Not on file   Social History Narrative   Lives with wife Olin Hauser and son [name] is mentally handicapped age 68   Walk for exercise   Shoots pistol and reloads own pistol          Family History  Problem Relation Age of Onset  . Hypertension Father   . Heart disease Father   . Arthritis Father   . Diabetes Father   . Kidney disease Father   . Arthritis Mother   . Learning disabilities Son   . Mental retardation Son   . Allergic rhinitis Neg Hx   . Angioedema Neg Hx   . Asthma Neg Hx   . Eczema Neg Hx   .  Immunodeficiency Neg Hx   . Urticaria Neg Hx     Review of Systems  Constitutional: Negative for chills, fever and weight loss.  HENT: Negative for congestion and hearing loss.   Eyes: Negative for blurred vision and pain.  Respiratory: Negative for cough and shortness of breath.   Cardiovascular: Negative for chest pain and leg swelling.  Gastrointestinal: Negative for abdominal pain, constipation, diarrhea and heartburn.  Genitourinary: Negative for dysuria and frequency.  Musculoskeletal: Positive for back pain and neck pain. Negative for falls, joint pain and myalgias.       Occasional neck and back pain, occasional arthritis.  Old neck and back surgery  Neurological: Negative for dizziness, seizures and headaches.  Psychiatric/Behavioral: Negative for depression. The patient has insomnia. The patient is not nervous/anxious.        Treated    BP 138/82   Pulse 72   Temp 98.3 F (36.8 C) (Temporal)   Resp 16   Ht 5\' 7"  (1.702 m)   Wt 189 lb 1.9 oz (85.8 kg)   SpO2 98%   BMI 29.62 kg/m   Physical Exam  Constitutional: He is oriented to person, place, and time. He appears well-developed and well-nourished.  HENT:  Head: Normocephalic and atraumatic.  Mouth/Throat: Oropharynx is clear and moist.  Dentures  Eyes: Pupils are equal, round, and reactive to light. Conjunctivae are normal.  Neck: Normal range of motion. Neck supple. No thyromegaly present.  Cardiovascular: Normal rate, regular rhythm and normal heart sounds.   Pulmonary/Chest: Effort normal and breath sounds normal. No respiratory distress.  Abdominal: Soft. Bowel sounds are normal.  Musculoskeletal: Normal range of motion. He exhibits no edema.  Lymphadenopathy:    He has no cervical adenopathy.  Neurological: He is alert and oriented to person, place, and time.  Gait normal  Skin: Skin is warm and dry.  Psychiatric: He has a normal mood and affect. His behavior is normal. Thought content normal.  Nursing  note and vitals reviewed.   1. Allergy to meat Get records from allergist.  Patient states he has a meat allergy after tick bite,   alpha gal positive.  He can have a flu shot because of the gelatin component.  2. Elevated blood pressure, situational Prior history of hypertension.  Currently not treated.  Will follow  3. Hyperlipidemia, unspecified hyperlipidemia type By history.  Not medicated.  Will check labs from prior office and follow   Patient Instructions  Need old records Dr Wenda Overland Need old records Dr  Hall Need recent labs Bring in allergy information if you have it  Walk every day that you are able  See me in a month Call sooner for problems   Raylene Everts, MD

## 2017-08-19 NOTE — Patient Instructions (Signed)
Need old records Dr Wenda Overland Need old records Dr [place] Crane Need recent labs Bring in allergy information if you have it  Walk every day that you are able  See me in a month Call sooner for problems

## 2017-08-20 DIAGNOSIS — M47816 Spondylosis without myelopathy or radiculopathy, lumbar region: Secondary | ICD-10-CM | POA: Diagnosis not present

## 2017-08-20 DIAGNOSIS — S336XXA Sprain of sacroiliac joint, initial encounter: Secondary | ICD-10-CM | POA: Diagnosis not present

## 2017-08-20 DIAGNOSIS — M9903 Segmental and somatic dysfunction of lumbar region: Secondary | ICD-10-CM | POA: Diagnosis not present

## 2017-08-25 DIAGNOSIS — M9903 Segmental and somatic dysfunction of lumbar region: Secondary | ICD-10-CM | POA: Diagnosis not present

## 2017-08-25 DIAGNOSIS — S336XXA Sprain of sacroiliac joint, initial encounter: Secondary | ICD-10-CM | POA: Diagnosis not present

## 2017-08-25 DIAGNOSIS — M47816 Spondylosis without myelopathy or radiculopathy, lumbar region: Secondary | ICD-10-CM | POA: Diagnosis not present

## 2017-08-27 DIAGNOSIS — S336XXA Sprain of sacroiliac joint, initial encounter: Secondary | ICD-10-CM | POA: Diagnosis not present

## 2017-08-27 DIAGNOSIS — M47816 Spondylosis without myelopathy or radiculopathy, lumbar region: Secondary | ICD-10-CM | POA: Diagnosis not present

## 2017-08-27 DIAGNOSIS — M9903 Segmental and somatic dysfunction of lumbar region: Secondary | ICD-10-CM | POA: Diagnosis not present

## 2017-09-01 DIAGNOSIS — M9903 Segmental and somatic dysfunction of lumbar region: Secondary | ICD-10-CM | POA: Diagnosis not present

## 2017-09-01 DIAGNOSIS — M47816 Spondylosis without myelopathy or radiculopathy, lumbar region: Secondary | ICD-10-CM | POA: Diagnosis not present

## 2017-09-01 DIAGNOSIS — S336XXA Sprain of sacroiliac joint, initial encounter: Secondary | ICD-10-CM | POA: Diagnosis not present

## 2017-09-08 DIAGNOSIS — M9903 Segmental and somatic dysfunction of lumbar region: Secondary | ICD-10-CM | POA: Diagnosis not present

## 2017-09-08 DIAGNOSIS — S336XXA Sprain of sacroiliac joint, initial encounter: Secondary | ICD-10-CM | POA: Diagnosis not present

## 2017-09-08 DIAGNOSIS — M47816 Spondylosis without myelopathy or radiculopathy, lumbar region: Secondary | ICD-10-CM | POA: Diagnosis not present

## 2017-09-15 DIAGNOSIS — S336XXA Sprain of sacroiliac joint, initial encounter: Secondary | ICD-10-CM | POA: Diagnosis not present

## 2017-09-15 DIAGNOSIS — M47816 Spondylosis without myelopathy or radiculopathy, lumbar region: Secondary | ICD-10-CM | POA: Diagnosis not present

## 2017-09-15 DIAGNOSIS — M9903 Segmental and somatic dysfunction of lumbar region: Secondary | ICD-10-CM | POA: Diagnosis not present

## 2017-09-24 ENCOUNTER — Other Ambulatory Visit: Payer: Self-pay

## 2017-09-24 ENCOUNTER — Telehealth: Payer: Self-pay | Admitting: Family Medicine

## 2017-09-24 ENCOUNTER — Encounter: Payer: Self-pay | Admitting: Family Medicine

## 2017-09-24 ENCOUNTER — Ambulatory Visit (INDEPENDENT_AMBULATORY_CARE_PROVIDER_SITE_OTHER): Payer: Medicare Other | Admitting: Family Medicine

## 2017-09-24 VITALS — BP 138/74 | HR 72 | Temp 97.6°F | Resp 18 | Ht 67.0 in | Wt 191.1 lb

## 2017-09-24 DIAGNOSIS — M4726 Other spondylosis with radiculopathy, lumbar region: Secondary | ICD-10-CM

## 2017-09-24 DIAGNOSIS — M5442 Lumbago with sciatica, left side: Secondary | ICD-10-CM

## 2017-09-24 DIAGNOSIS — E785 Hyperlipidemia, unspecified: Secondary | ICD-10-CM | POA: Diagnosis not present

## 2017-09-24 MED ORDER — METHYLPREDNISOLONE 4 MG PO TBPK: ORAL_TABLET | ORAL | 0 refills | Status: DC

## 2017-09-24 MED ORDER — HYDROCODONE-ACETAMINOPHEN 7.5-325 MG PO TABS: ORAL_TABLET | ORAL | 0 refills | Status: DC

## 2017-09-24 NOTE — Patient Instructions (Signed)
Get lab test for cholesterol today I will send you a letter with your test results.  If there is anything of concern, we will call right away.  I have referred to [place] [facility]  Take the medrol pak for inflammation Take the pain medicine if needed  See me in 3 months

## 2017-09-24 NOTE — Progress Notes (Signed)
Chief Complaint  Patient presents with  . Follow-up    1 month   Here for follow up Has low back pain for 2 months Patient has had lumbar surgery, and 2 cervical surgeries.  He has a history of disc disease.  He was a [employer] most of his life.  For the last 2 months has had pain in his low back that radiates into the left buttock and down the back of the left thigh.  He has seen his chiropractor.  He has done exercises.  He is limit his activity.  He has been taking ibuprofen.  In spite of this he still has pain.  He feels like he needs to go back to his spine surgeon.  We discussed conservative management of low back pain.  I looked at his last MRI and he did have multilevel disc disease.  I did offer him a course of prednisone, and a few pain pills to last him until he gets seen. In the old record I found that he used to take pravastatin.  When I inquired about this he tells me that it was stopped when his prior doctor died, and not discontinued.  He has not had a lipid panel in over a year.  I am going to get a lipid panel today, and discussed with him that he likely needs to go back on a statin. His blood pressure is well controlled.  He is compliant with losartan Patient is up-to-date with his health maintenance  Patient Active Problem List   Diagnosis Date Noted  . Allergy to meat 08/19/2017  . Coronary atherosclerosis 06/04/2010  . Hyperlipidemia 06/01/2010  . Essential hypertension 06/01/2010  . ESOPHAGEAL REFLUX 06/01/2010  . Degenerative joint disease (DJD) of lumbar spine 06/01/2010  . INSOMNIA UNSPECIFIED 06/01/2010    Outpatient Encounter Medications as of 09/24/2017  Medication Sig  . aspirin 81 MG chewable tablet Chew 81 mg by mouth.  . diphenhydrAMINE (BENADRYL) 25 MG tablet Take 50 mg by mouth every 4 (four) hours as needed for allergies.  Marland Kitchen EPINEPHrine 0.3 mg/0.3 mL IJ SOAJ injection INJECT INTRAMUSCULARLY AS DIRECTED  . fexofenadine (ALLEGRA) 180 MG  tablet Take 180 mg by mouth daily.  Marland Kitchen gabapentin (NEURONTIN) 400 MG capsule Take 1 capsule (400 mg total) by mouth 2 (two) times daily.  Marland Kitchen losartan (COZAAR) 100 MG tablet Take 100 mg by mouth.  . naproxen sodium (ANAPROX) 220 MG tablet Take 220 mg by mouth daily as needed (pain).  . traZODone (DESYREL) 150 MG tablet Take 1 tablet (150 mg total) by mouth at bedtime.  Marland Kitchen HYDROcodone-acetaminophen (NORCO) 7.5-325 MG tablet I tab po every 4 hours for pain  . methylPREDNISolone (MEDROL DOSEPAK) 4 MG TBPK tablet tad  . pravastatin (PRAVACHOL) 40 MG tablet Take 40 mg by mouth.   No facility-administered encounter medications on file as of 09/24/2017.     Allergies  Allergen Reactions  . Prednisone Hives  . Meloxicam Other (See Comments)    Abdominal pain  . Ace Inhibitors Cough    REACTION: cough  . Gemfibrozil Other (See Comments)    REACTION: muscle aches  . Statins Other (See Comments)    REACTION: myalgia    Review of Systems  Constitutional: Negative for activity change, appetite change and fatigue.  HENT: Negative for congestion and dental problem.   Eyes: Negative for photophobia and visual disturbance.  Respiratory: Negative for cough and shortness of breath.   Cardiovascular: Negative for chest  pain, palpitations and leg swelling.  Gastrointestinal: Negative for abdominal distention, constipation and diarrhea.  Genitourinary: Negative for difficulty urinating and frequency.  Musculoskeletal: Positive for back pain. Negative for gait problem, neck pain and neck stiffness.  Neurological: Negative for weakness and numbness.       Sciatic pain posterior left thigh  Psychiatric/Behavioral: Negative for behavioral problems. The patient is not nervous/anxious.     BP 138/74 (BP Location: Left Arm, Patient Position: Sitting, Cuff Size: Normal)   Pulse 72   Temp 97.6 F (36.4 C) (Temporal)   Resp 18   Ht 5\' 7"  (1.702 m)   Wt 191 lb 1.9 oz (86.7 kg)   SpO2 98%   BMI 29.93 kg/m     Physical Exam  Constitutional: He is oriented to person, place, and time. He appears well-developed and well-nourished.  Slightly guarded and slow movements  HENT:  Head: Normocephalic and atraumatic.  Mouth/Throat: Oropharynx is clear and moist.  Eyes: Conjunctivae are normal. Pupils are equal, round, and reactive to light.  Neck: Normal range of motion. Neck supple.  Cardiovascular: Normal rate, regular rhythm and normal heart sounds.  Pulmonary/Chest: Effort normal and breath sounds normal.  Musculoskeletal: Normal range of motion. He exhibits no edema.  Patient can flex to fingertips 1 inch from the floor.  Full extension.  Pain with lateral movement.  Back is straight and symmetric.  No palpable tenderness.  No palpable muscle spasm.  Patient can stand on his heels and toes and do a partial deep knee bend without evidence of muscular weakness.  Reflexes are normal at the knee and ankle.  Straight leg raise is negative on the right, on the left he has some pain with full leg extension.  No sensory deficits.  Neurological: He is alert and oriented to person, place, and time. He displays normal reflexes. No sensory deficit. Coordination normal.  Psychiatric: He has a normal mood and affect. His behavior is normal.    ASSESSMENT/PLAN:  1. Osteoarthritis of spine with radiculopathy, lumbar region Chronic back condition with exacerbation.  No improvement with 6 weeks of conservative therapy including relative rest, chiropractic care, ibuprofen - Ambulatory referral to Neurosurgery  2. Acute left-sided low back pain with left-sided sciatica We will prescribe Medrol.  Refer back to [place] [facility] - Ambulatory referral to Neurosurgery  3. Hyperlipidemia, unspecified hyperlipidemia type Previously took pravastatin.  Will check lipid panel and likely resume medication - Lipid panel   Patient Instructions  Get lab test for cholesterol today I will send you a letter with  your test results.  If there is anything of concern, we will call right away.  I have referred to [place] [facility]  Take the medrol pak for inflammation Take the pain medicine if needed  See me in 3 months     Raylene Everts, MD

## 2017-11-12 DIAGNOSIS — Z6829 Body mass index (BMI) 29.0-29.9, adult: Secondary | ICD-10-CM | POA: Diagnosis not present

## 2017-11-12 DIAGNOSIS — R030 Elevated blood-pressure reading, without diagnosis of hypertension: Secondary | ICD-10-CM | POA: Diagnosis not present

## 2017-11-12 DIAGNOSIS — M5412 Radiculopathy, cervical region: Secondary | ICD-10-CM | POA: Diagnosis not present

## 2017-11-12 DIAGNOSIS — M542 Cervicalgia: Secondary | ICD-10-CM | POA: Diagnosis not present

## 2017-11-27 DIAGNOSIS — M542 Cervicalgia: Secondary | ICD-10-CM | POA: Diagnosis not present

## 2017-11-27 DIAGNOSIS — M5412 Radiculopathy, cervical region: Secondary | ICD-10-CM | POA: Diagnosis not present

## 2017-12-08 DIAGNOSIS — M5021 Other cervical disc displacement,  high cervical region: Secondary | ICD-10-CM | POA: Diagnosis not present

## 2017-12-08 DIAGNOSIS — M5412 Radiculopathy, cervical region: Secondary | ICD-10-CM | POA: Diagnosis not present

## 2017-12-08 DIAGNOSIS — R030 Elevated blood-pressure reading, without diagnosis of hypertension: Secondary | ICD-10-CM | POA: Diagnosis not present

## 2017-12-08 DIAGNOSIS — M542 Cervicalgia: Secondary | ICD-10-CM | POA: Diagnosis not present

## 2017-12-08 DIAGNOSIS — Z6829 Body mass index (BMI) 29.0-29.9, adult: Secondary | ICD-10-CM | POA: Diagnosis not present

## 2017-12-09 DIAGNOSIS — M47816 Spondylosis without myelopathy or radiculopathy, lumbar region: Secondary | ICD-10-CM | POA: Diagnosis not present

## 2017-12-09 DIAGNOSIS — S336XXA Sprain of sacroiliac joint, initial encounter: Secondary | ICD-10-CM | POA: Diagnosis not present

## 2017-12-09 DIAGNOSIS — M9903 Segmental and somatic dysfunction of lumbar region: Secondary | ICD-10-CM | POA: Diagnosis not present

## 2017-12-23 ENCOUNTER — Other Ambulatory Visit: Payer: Self-pay

## 2017-12-23 ENCOUNTER — Ambulatory Visit (INDEPENDENT_AMBULATORY_CARE_PROVIDER_SITE_OTHER): Payer: Medicare Other | Admitting: Family Medicine

## 2017-12-23 ENCOUNTER — Encounter: Payer: Self-pay | Admitting: Family Medicine

## 2017-12-23 VITALS — BP 128/82 | HR 63 | Temp 98.0°F | Ht 67.0 in | Wt 192.0 lb

## 2017-12-23 DIAGNOSIS — K219 Gastro-esophageal reflux disease without esophagitis: Secondary | ICD-10-CM

## 2017-12-23 DIAGNOSIS — I2510 Atherosclerotic heart disease of native coronary artery without angina pectoris: Secondary | ICD-10-CM | POA: Diagnosis not present

## 2017-12-23 DIAGNOSIS — M4726 Other spondylosis with radiculopathy, lumbar region: Secondary | ICD-10-CM | POA: Diagnosis not present

## 2017-12-23 DIAGNOSIS — Z91018 Allergy to other foods: Secondary | ICD-10-CM | POA: Diagnosis not present

## 2017-12-23 DIAGNOSIS — G4700 Insomnia, unspecified: Secondary | ICD-10-CM

## 2017-12-23 DIAGNOSIS — E785 Hyperlipidemia, unspecified: Secondary | ICD-10-CM | POA: Diagnosis not present

## 2017-12-23 DIAGNOSIS — I10 Essential (primary) hypertension: Secondary | ICD-10-CM

## 2017-12-23 LAB — LIPID PANEL
Cholesterol: 196 mg/dL (ref <200)
HDL: 34 mg/dL — ABNORMAL LOW (ref >40)
LDL Cholesterol (Calc): 131 mg/dL (calc) — ABNORMAL HIGH
Non-HDL Cholesterol (Calc): 162 mg/dL (calc) — ABNORMAL HIGH (ref <130)
Total CHOL/HDL Ratio: 5.8 (calc) — ABNORMAL HIGH (ref <5.0)
Triglycerides: 168 mg/dL — ABNORMAL HIGH (ref <150)

## 2017-12-23 MED ORDER — GABAPENTIN 400 MG PO CAPS
400.0000 mg | ORAL_CAPSULE | Freq: Two times a day (BID) | ORAL | 0 refills | Status: DC
Start: 2017-12-23 — End: 2019-09-06

## 2017-12-23 MED ORDER — FLUTICASONE PROPIONATE 50 MCG/ACT NA SUSP: 2.0000 | Freq: Every day | NASAL | 0 refills | Status: DC

## 2017-12-23 MED ORDER — MOMETASONE FUROATE 50 MCG/ACT NA SUSP: 2.0000 | Freq: Every day | NASAL | 12 refills | Status: DC

## 2017-12-23 MED ORDER — LOSARTAN POTASSIUM 100 MG PO TABS: 100.0000 mg | ORAL_TABLET | Freq: Every day | ORAL | 1 refills | Status: DC

## 2017-12-23 NOTE — Patient Instructions (Addendum)
You need lab tests today to check the cholesterol Blood pressure is good I will prescribe nasonex for the post nasal drip  Need appointments every 6 months You will see Caren Macadam, MD next time

## 2017-12-23 NOTE — Progress Notes (Signed)
Chief Complaint  Patient presents with  . Follow-up  Patient is here for routine follow-up. His blood pressure is well controlled. He is feeling well.  No chest pain or shortness of breath. Appetite and energy level are good. He continues to be very careful with his diet because of his meat allergy. He did not get cholesterol testing at his last visit.  He is advised to go to the lab today. When he was last seen he was symptomatic from his lumbar degenerative disc disease.  He states that this flares periodically but is not bothering him today.   Patient Active Problem List   Diagnosis Date Noted  . Allergy to alpha-gal 08/19/2017  . Coronary atherosclerosis 06/04/2010  . Hyperlipidemia 06/01/2010  . Essential hypertension 06/01/2010  . Esophageal reflux 06/01/2010  . Degenerative joint disease (DJD) of lumbar spine 06/01/2010  . INSOMNIA UNSPECIFIED 06/01/2010    Outpatient Encounter Medications as of 12/23/2017  Medication Sig  . aspirin 81 MG chewable tablet Chew 81 mg by mouth.  . diphenhydrAMINE (BENADRYL) 25 MG tablet Take 50 mg by mouth every 4 (four) hours as needed for allergies.  Marland Kitchen EPINEPHrine 0.3 mg/0.3 mL IJ SOAJ injection INJECT INTRAMUSCULARLY AS DIRECTED  . fexofenadine (ALLEGRA) 180 MG tablet Take 180 mg by mouth daily.  Marland Kitchen gabapentin (NEURONTIN) 400 MG capsule Take 1 capsule (400 mg total) by mouth 2 (two) times daily.  Marland Kitchen HYDROcodone-acetaminophen (NORCO) 7.5-325 MG tablet I tab po every 4 hours for pain  . losartan (COZAAR) 100 MG tablet Take 1 tablet (100 mg total) by mouth daily.  . naproxen sodium (ANAPROX) 220 MG tablet Take 220 mg by mouth daily as needed (pain).  . traZODone (DESYREL) 150 MG tablet Take 1 tablet (150 mg total) by mouth at bedtime.  . mometasone (NASONEX) 50 MCG/ACT nasal spray Place 2 sprays into the nose daily.  . pravastatin (PRAVACHOL) 40 MG tablet Take 40 mg by mouth.   No facility-administered encounter medications on file as of  12/23/2017.     Allergies  Allergen Reactions  . Prednisone Hives  . Meloxicam Other (See Comments)    Abdominal pain  . Ace Inhibitors Cough    REACTION: cough  . Gemfibrozil Other (See Comments)    REACTION: muscle aches  . Statins Other (See Comments)    REACTION: myalgia    Review of Systems  Constitutional: Negative for activity change, appetite change and fatigue.  HENT: Positive for postnasal drip and rhinorrhea. Negative for congestion and dental problem.        Frequent clearing of throat  Eyes: Negative for photophobia and visual disturbance.  Respiratory: Negative for cough and shortness of breath.   Cardiovascular: Negative for chest pain, palpitations and leg swelling.  Gastrointestinal: Negative for abdominal distention, constipation and diarrhea.  Genitourinary: Negative for difficulty urinating and frequency.  Musculoskeletal: Positive for back pain. Negative for gait problem, neck pain and neck stiffness.       Occasional  Neurological: Negative for weakness and numbness.  Psychiatric/Behavioral: Negative for behavioral problems. The patient is not nervous/anxious.     BP 128/82 (BP Location: Right Arm, Patient Position: Sitting, Cuff Size: Normal)   Pulse 63   Temp 98 F (36.7 C) (Oral)   Ht 5\' 7"  (1.702 m)   Wt 192 lb (87.1 kg)   SpO2 99%   BMI 30.07 kg/m   Physical Exam  Constitutional: He is oriented to person, place, and time. He appears well-developed and well-nourished.  HENT:  Head: Normocephalic and atraumatic.  Mouth/Throat: Oropharynx is clear and moist.  Dentures  Eyes: Conjunctivae are normal. Pupils are equal, round, and reactive to light.  Neck: Normal range of motion. Neck supple. No thyromegaly present.  Cardiovascular: Normal rate, regular rhythm and normal heart sounds.  Pulmonary/Chest: Effort normal and breath sounds normal. No respiratory distress.  Abdominal: Soft. Bowel sounds are normal.  Musculoskeletal: Normal range of  motion. He exhibits no edema.  Lymphadenopathy:    He has no cervical adenopathy.  Neurological: He is alert and oriented to person, place, and time.  Gait normal  Skin: Skin is warm and dry.  Psychiatric: He has a normal mood and affect. His behavior is normal. Thought content normal.  Nursing note and vitals reviewed.   ASSESSMENT/PLAN:  1. Osteoarthritis of spine with radiculopathy, lumbar region With periodic flares.  No current pain.  Under the care of spine specialty Dr. Vertell Limber  2. Hyperlipidemia, unspecified hyperlipidemia type Needs lipid panel.  He will go today  3. Allergy to alpha-gal Sequela from tick bite.  Followed by allergy  4. Insomnia, unspecified type Chronic  5. Essential hypertension Controlled  6. Gastroesophageal reflux disease, esophagitis presence not specified Asymptomatic on medication  7. Atherosclerosis of native coronary artery of native heart without angina pectoris Under care of cardiology.  Asymptomatic.   Patient Instructions  You need lab tests today to check the cholesterol Blood pressure is good I will prescribe nasonex for the post nasal drip  Need appointments every 6 months You will see Caren Macadam, MD next time   Raylene Everts, MD

## 2017-12-25 ENCOUNTER — Other Ambulatory Visit: Payer: Self-pay

## 2017-12-25 MED ORDER — OLMESARTAN MEDOXOMIL 20 MG PO TABS: 20.0000 mg | ORAL_TABLET | Freq: Every day | ORAL | 0 refills | Status: DC

## 2018-03-03 DIAGNOSIS — M47816 Spondylosis without myelopathy or radiculopathy, lumbar region: Secondary | ICD-10-CM | POA: Diagnosis not present

## 2018-03-03 DIAGNOSIS — S336XXA Sprain of sacroiliac joint, initial encounter: Secondary | ICD-10-CM | POA: Diagnosis not present

## 2018-03-03 DIAGNOSIS — M9903 Segmental and somatic dysfunction of lumbar region: Secondary | ICD-10-CM | POA: Diagnosis not present

## 2018-03-26 ENCOUNTER — Encounter: Payer: Self-pay | Admitting: Family Medicine

## 2018-03-27 ENCOUNTER — Encounter: Payer: Self-pay | Admitting: Family Medicine

## 2018-04-28 ENCOUNTER — Other Ambulatory Visit: Payer: Self-pay | Admitting: Family Medicine

## 2018-04-29 ENCOUNTER — Encounter: Payer: Self-pay | Admitting: Allergy

## 2018-06-02 DIAGNOSIS — M9903 Segmental and somatic dysfunction of lumbar region: Secondary | ICD-10-CM | POA: Diagnosis not present

## 2018-06-02 DIAGNOSIS — M47816 Spondylosis without myelopathy or radiculopathy, lumbar region: Secondary | ICD-10-CM | POA: Diagnosis not present

## 2018-06-02 DIAGNOSIS — S336XXA Sprain of sacroiliac joint, initial encounter: Secondary | ICD-10-CM | POA: Diagnosis not present

## 2018-06-24 DIAGNOSIS — I10 Essential (primary) hypertension: Secondary | ICD-10-CM | POA: Diagnosis not present

## 2018-06-24 DIAGNOSIS — N529 Male erectile dysfunction, unspecified: Secondary | ICD-10-CM | POA: Diagnosis not present

## 2018-06-24 DIAGNOSIS — Z125 Encounter for screening for malignant neoplasm of prostate: Secondary | ICD-10-CM | POA: Diagnosis not present

## 2018-06-24 DIAGNOSIS — E559 Vitamin D deficiency, unspecified: Secondary | ICD-10-CM | POA: Diagnosis not present

## 2018-06-24 DIAGNOSIS — R6882 Decreased libido: Secondary | ICD-10-CM | POA: Diagnosis not present

## 2018-06-24 DIAGNOSIS — E785 Hyperlipidemia, unspecified: Secondary | ICD-10-CM | POA: Diagnosis not present

## 2018-06-30 ENCOUNTER — Ambulatory Visit: Payer: Medicare Other | Admitting: Family Medicine

## 2018-07-07 ENCOUNTER — Encounter: Payer: Self-pay | Admitting: Allergy

## 2018-07-30 ENCOUNTER — Ambulatory Visit (INDEPENDENT_AMBULATORY_CARE_PROVIDER_SITE_OTHER): Payer: Medicare Other | Admitting: Allergy

## 2018-07-30 ENCOUNTER — Encounter: Payer: Self-pay | Admitting: Allergy

## 2018-07-30 VITALS — BP 128/76 | HR 55 | Temp 97.6°F | Resp 16 | Ht 62.0 in | Wt 201.8 lb

## 2018-07-30 DIAGNOSIS — T7840XD Allergy, unspecified, subsequent encounter: Secondary | ICD-10-CM

## 2018-07-30 DIAGNOSIS — I2510 Atherosclerotic heart disease of native coronary artery without angina pectoris: Secondary | ICD-10-CM | POA: Diagnosis not present

## 2018-07-30 DIAGNOSIS — J3089 Other allergic rhinitis: Secondary | ICD-10-CM

## 2018-07-30 NOTE — Progress Notes (Signed)
Follow-up Note  RE: Jonathan Gibson MRN: [id number] DOB: 1951/01/14 Date of Office Visit: 07/30/2018   History of present illness: Jonathan Gibson is a 67 y.o. male presenting today for follow-up of alpha gal allergy as well as allergic rhinitis.  He presents today with his wife.  He was last seen in the office on July 04, 2017 by myself.  He has been avoiding all red meat products over the past year.  And he also states he tries to avoid gelatin.  However his wife states he is still eating a lot of cheese almost daily.  Recently over the last month or 2 he has been having episodic lip swelling without any tongue involvement.  He denies any trouble swallowing or breathing.  The swelling will go down over the course of the day.  His wife also states he has had his genitalia swell as well.  He states the swelling has occurred after she is the intensity rolls, a Maple nut candy, a peanut butter lot candy that they can recall.  Wife states she tried to go through the ingredients and did not think there is anything in the ingredients that would cause him to react.  I did look up Tootsie Roll in particular and there is gelatin in the ingredients list.  He states he eats peanut butter almost daily and does not have symptoms with lip swelling or any other allergic reaction type symptoms after eating peanut butter on most days.  He states he has not had any further tick bites since his last visit.  He states he is taking Allegra daily in the morning.  He has not required use of his epinephrine device. At this time he denies any significant nasal congestion or drainage.  He has used Dymista in the past.  Review of systems: Review of Systems  Constitutional: Negative for chills, fever and malaise/fatigue.  HENT: Negative for congestion, ear discharge, nosebleeds, sinus pain and sore throat.   Eyes: Negative for pain, discharge and redness.  Respiratory: Negative for cough, shortness of breath and  wheezing.   Cardiovascular: Negative for chest pain.  Gastrointestinal: Negative for abdominal pain, constipation, diarrhea, heartburn, nausea and vomiting.  Musculoskeletal: Negative for joint pain.  Skin: Negative for itching and rash.  Neurological: Negative for headaches.    All other systems negative unless noted above in HPI  Past medical/social/surgical/family history have been reviewed and are unchanged unless specifically indicated below.  No changes  Medication List: Allergies as of 07/30/2018      Reactions   Meloxicam Other (See Comments)   Abdominal pain   Ace Inhibitors Cough   REACTION: cough   Gemfibrozil Other (See Comments)   REACTION: muscle aches   Statins Other (See Comments)   REACTION: myalgia      Medication List        Accurate as of 07/30/18 12:27 PM. Always use your most recent med list.          aspirin 81 MG chewable tablet Chew 81 mg by mouth.   diphenhydrAMINE 25 MG tablet Commonly known as:  BENADRYL Take 50 mg by mouth every 4 (four) hours as needed for allergies.   EPINEPHrine 0.3 mg/0.3 mL Soaj injection Commonly known as:  EPI-PEN INJECT INTRAMUSCULARLY AS DIRECTED   fexofenadine 180 MG tablet Commonly known as:  ALLEGRA Take 180 mg by mouth daily.   fluticasone 50 MCG/ACT nasal spray Commonly known as:  FLONASE Place 2 sprays into both nostrils  daily.   gabapentin 400 MG capsule Commonly known as:  NEURONTIN Take 1 capsule (400 mg total) by mouth 2 (two) times daily.   mometasone 50 MCG/ACT nasal spray Commonly known as:  NASONEX Place 2 sprays into the nose daily.   naproxen sodium 220 MG tablet Commonly known as:  ALEVE Take 220 mg by mouth daily as needed (pain).   olmesartan 20 MG tablet Commonly known as:  BENICAR TAKE 1 TABLET BY MOUTH ONCE DAILY   pravastatin 40 MG tablet Commonly known as:  PRAVACHOL Take 40 mg by mouth.   traZODone 150 MG tablet Commonly known as:  DESYREL Take 1 tablet (150 mg  total) by mouth at bedtime.       Known medication allergies: Allergies  Allergen Reactions  . Meloxicam Other (See Comments)    Abdominal pain  . Ace Inhibitors Cough    REACTION: cough  . Gemfibrozil Other (See Comments)    REACTION: muscle aches  . Statins Other (See Comments)    REACTION: myalgia     Physical examination: Blood pressure 128/76, pulse (!) 55, temperature 97.6 F (36.4 C), temperature source Oral, resp. rate 16, height 5\' 2"  (1.575 m), weight 201 lb 12.8 oz (91.5 kg), SpO2 96 %.  General: Alert, interactive, in no acute distress. HEENT: PERRLA, TMs pearly gray, turbinates non-edematous without discharge, post-pharynx non erythematous.  Left upper right lip is slightly edematous.   Neck: Supple without lymphadenopathy. Lungs: Clear to auscultation without wheezing, rhonchi or rales. {no increased work of breathing. CV: Normal S1, S2 without murmurs. Abdomen: Nondistended, nontender. Skin: Warm and dry, without lesions or rashes. Extremities:  No clubbing, cyanosis or edema. Neuro:   Grossly intact.  Diagnositics/Labs: None today.  Assessment and plan:   Allergic reaction/Alpha-gal Allergy    - will repeat alpha-gal panel today and tryptase.  Will also obtain milk and peanut IgE levels.  He will continue to avoid red meat products as well as gelatin and dairy at this time.    - have access to Epipen in case of another reaction.  Follow emergency action plan.      - continue daily antihistamine, Allegra 180mg .   For swelling episodes can take additional dose of Allegra and take 1 tablet in AM and 1 tablet in PM.    Allergic rhinitis    - for nasal congestion and drainage can use Dymista sample which helps with nasal drainage/throat clearing.  Otherwise can use nasal steroid spray like Fluticasone or Nasonex for nasal congestion    - daily antihistamine as above  Follow-up 1 year or sooner if needed.   I appreciate the opportunity to take part in  Jonathan Gibson's care. Please do not hesitate to contact me with questions.  Sincerely,   Prudy Feeler, MD Allergy/Immunology [facility]

## 2018-07-30 NOTE — Patient Instructions (Addendum)
Allergic reaction/Alpha-gal Allergy    - will repeat alpha-gal panel today and tryptase.  Will also obtain milk and peanut IgE levels.     - have access to Epipen in case of another reaction.  Follow emergency action plan.      - continue daily antihistamine, Allegra 180mg .   For swelling episodes can take additional dose of Allegra and take 1 tablet in AM and 1 tablet in PM.    Allergic rhinitis    - for nasal congestion and drainage can use Dymista sample which helps with nasal drainage/throat clearing.  Otherwise can use nasal steroid spray like Fluticasone or Nasonex for nasal congestion    - daily antihistamine as above  Follow-up 1 year or sooner if needed.

## 2018-08-04 LAB — ALLERGEN, PEANUT F13: Peanut IgE: <0.1 kU/L

## 2018-08-04 LAB — ALPHA-GAL PANEL
Alpha Gal IgE*: 1.57 kU/L — ABNORMAL HIGH (ref <0.10)
BEEF (BOS SPP) IGE: 1.02 kU/L — AB (ref <0.35)
BEEF CLASS INTERPRETATION: 2
Class Interpretation: 2
LAMB/MUTTON (OVIS SPP) IGE: 0.32 kU/L (ref <0.35)
Pork (Sus spp) IgE: 0.7 kU/L — ABNORMAL HIGH (ref <0.35)

## 2018-08-04 LAB — TRYPTASE: TRYPTASE: 12.2 ug/L (ref 2.2–13.2)

## 2018-08-04 LAB — ALLERGEN MILK: Milk IgE: 0.25 kU/L — AB

## 2018-08-11 DIAGNOSIS — N529 Male erectile dysfunction, unspecified: Secondary | ICD-10-CM | POA: Diagnosis not present

## 2018-08-11 DIAGNOSIS — R6882 Decreased libido: Secondary | ICD-10-CM | POA: Diagnosis not present

## 2018-08-11 DIAGNOSIS — E559 Vitamin D deficiency, unspecified: Secondary | ICD-10-CM | POA: Diagnosis not present

## 2018-08-11 DIAGNOSIS — I10 Essential (primary) hypertension: Secondary | ICD-10-CM | POA: Diagnosis not present

## 2018-09-01 DIAGNOSIS — S336XXA Sprain of sacroiliac joint, initial encounter: Secondary | ICD-10-CM | POA: Diagnosis not present

## 2018-09-01 DIAGNOSIS — M47816 Spondylosis without myelopathy or radiculopathy, lumbar region: Secondary | ICD-10-CM | POA: Diagnosis not present

## 2018-09-01 DIAGNOSIS — M9903 Segmental and somatic dysfunction of lumbar region: Secondary | ICD-10-CM | POA: Diagnosis not present

## 2018-09-23 ENCOUNTER — Other Ambulatory Visit: Payer: Self-pay

## 2018-09-23 ENCOUNTER — Encounter (HOSPITAL_COMMUNITY): Payer: Self-pay | Admitting: *Deleted

## 2018-09-23 ENCOUNTER — Emergency Department (HOSPITAL_COMMUNITY)
Admission: EM | Admit: 2018-09-23 | Discharge: 2018-09-23 | Disposition: A | Discharge status: Home / Self Care | Payer: Medicare Other | Attending: Emergency Medicine | Admitting: Emergency Medicine

## 2018-09-23 DIAGNOSIS — R21 Rash and other nonspecific skin eruption: Secondary | ICD-10-CM | POA: Insufficient documentation

## 2018-09-23 DIAGNOSIS — Z79899 Other long term (current) drug therapy: Secondary | ICD-10-CM | POA: Insufficient documentation

## 2018-09-23 DIAGNOSIS — I10 Essential (primary) hypertension: Secondary | ICD-10-CM | POA: Insufficient documentation

## 2018-09-23 DIAGNOSIS — Z87891 Personal history of nicotine dependence: Secondary | ICD-10-CM | POA: Insufficient documentation

## 2018-09-23 DIAGNOSIS — T7840XA Allergy, unspecified, initial encounter: Secondary | ICD-10-CM | POA: Diagnosis not present

## 2018-09-23 MED ORDER — FAMOTIDINE 20 MG PO TABS: 20.0000 mg | ORAL_TABLET | Freq: Two times a day (BID) | ORAL | 0 refills | Status: DC

## 2018-09-23 MED ORDER — DIPHENHYDRAMINE HCL 50 MG/ML IJ SOLN
25.0000 mg | Freq: Once | INTRAMUSCULAR | Status: AC
  Administered 2018-09-23: 25 mg via INTRAVENOUS
  Filled 2018-09-23: qty 1

## 2018-09-23 MED ORDER — METHYLPREDNISOLONE SODIUM SUCC 125 MG IJ SOLR
125.0000 mg | Freq: Once | INTRAMUSCULAR | Status: AC
  Administered 2018-09-23: 125 mg via INTRAVENOUS
  Filled 2018-09-23: qty 2

## 2018-09-23 MED ORDER — FAMOTIDINE IN NACL 20-0.9 MG/50ML-% IV SOLN
20.0000 mg | Freq: Once | INTRAVENOUS | Status: AC
  Administered 2018-09-23: 20 mg via INTRAVENOUS
  Filled 2018-09-23: qty 50

## 2018-09-23 NOTE — ED Triage Notes (Signed)
States he is beginning to feel short of breath.

## 2018-09-23 NOTE — ED Triage Notes (Signed)
Allergic reaction , states he has alpha gal, states he is itching all over, took benadryl and allegra without relief

## 2018-09-23 NOTE — Discharge Instructions (Addendum)
Take Benadryl every 4-6 hours for rash and itching and follow-up with your doctor if any problems

## 2018-09-23 NOTE — ED Provider Notes (Signed)
[facility] EMERGENCY DEPARTMENT Provider Note   CSN: [id number] Arrival date & time: 09/23/18  1039     History   Chief Complaint Chief Complaint  Patient presents with  . Allergic Reaction    HPI Jonathan Gibson is a 67 y.o. male.  Patient complains of a rash to his chest and arms.  The rash is pruritic.  Patient states that he has had this problem off and on before  The history is provided by the patient. No language interpreter was used.  Allergic Reaction  Presenting symptoms: itching and rash   Presenting symptoms: no difficulty breathing   Severity:  Moderate Prior allergic episodes:  Food/nut allergies Context: not animal exposure   Relieved by:  Nothing Worsened by:  Nothing Ineffective treatments:  Antihistamines   Past Medical History:  Diagnosis Date  . Allergy   . Anxiety   . Arthritis   . Cancer ([place])    skin  . Cataract   . GERD (gastroesophageal reflux disease)   . Heart murmur   . Hyperlipidemia   . Hypertension   . Nephrolithiasis   . Peripheral neuropathy     Patient Active Problem List   Diagnosis Date Noted  . Allergy to alpha-gal 08/19/2017  . Coronary atherosclerosis 06/04/2010  . Hyperlipidemia 06/01/2010  . Essential hypertension 06/01/2010  . Esophageal reflux 06/01/2010  . Degenerative joint disease (DJD) of lumbar spine 06/01/2010  . INSOMNIA UNSPECIFIED 06/01/2010    Past Surgical History:  Procedure Laterality Date  . CERVICAL DISCECTOMY    . COLONOSCOPY N/A 06/05/2017   Procedure: COLONOSCOPY;  Surgeon: Rogene Houston, MD;  [facility];  Service: Endoscopy;  Laterality: N/A;  1030  . dermatofibrosarcoma    . SPINE SURGERY     neck and low back        Home Medications    Prior to Admission medications   Medication Sig Start Date End Date Taking? Authorizing Provider  diphenhydrAMINE (BENADRYL) 25 MG tablet Take 50 mg by mouth every 4 (four) hours as needed for allergies.   Yes [provider]  EPINEPHrine 0.3 mg/0.3 mL IJ SOAJ injection INJECT INTRAMUSCULARLY AS DIRECTED 03/10/17  Yes [provider]  fexofenadine (ALLEGRA) 180 MG tablet Take 180 mg by mouth daily.   Yes [provider]  fluticasone (FLONASE) 50 MCG/ACT nasal spray Place 2 sprays into both nostrils daily. 12/23/17  Yes Raylene Everts, MD  gabapentin (NEURONTIN) 400 MG capsule Take 1 capsule (400 mg total) by mouth 2 (two) times daily. 12/23/17  Yes Raylene Everts, MD  mometasone (NASONEX) 50 MCG/ACT nasal spray Place 2 sprays into the nose daily. 12/23/17  Yes Raylene Everts, MD  naproxen sodium (ANAPROX) 220 MG tablet Take 220 mg by mouth daily as needed (pain).   Yes [provider]  olmesartan (BENICAR) 20 MG tablet TAKE 1 TABLET BY MOUTH ONCE DAILY 04/29/18  Yes Caren Macadam, MD  traZODone (DESYREL) 150 MG tablet Take 1 tablet (150 mg total) by mouth at bedtime. 08/19/17  Yes Raylene Everts, MD  famotidine (PEPCID) 20 MG tablet Take 1 tablet (20 mg total) by mouth 2 (two) times daily. 09/23/18   Milton Ferguson, MD    Family History Family History  Problem Relation Age of Onset  . Hypertension Father   . Heart disease Father   . Arthritis Father   . Diabetes Father   . Kidney disease Father   . Arthritis Mother   .  Learning disabilities Son   . Mental retardation Son   . Allergic rhinitis Neg Hx   . Angioedema Neg Hx   . Asthma Neg Hx   . Eczema Neg Hx   . Immunodeficiency Neg Hx   . Urticaria Neg Hx     Social History Social History   Tobacco Use  . Smoking status: Former Smoker    Packs/day: 1.00    Types: Cigarettes    Start date: 10/22/1963    Last attempt to quit: 08/24/1994    Years since quitting: 24.0  . Smokeless tobacco: Never Used  Substance Use Topics  . Alcohol use: No    Alcohol/week: 0.0 standard drinks  . Drug use: No     Allergies   Meloxicam; Ace inhibitors; Gemfibrozil; and Statins   Review of Systems Review of  Systems  Constitutional: Negative for appetite change and fatigue.  HENT: Negative for congestion, ear discharge and sinus pressure.   Eyes: Negative for discharge.  Respiratory: Negative for cough.   Cardiovascular: Negative for chest pain.  Gastrointestinal: Negative for abdominal pain and diarrhea.  Genitourinary: Negative for frequency and hematuria.  Musculoskeletal: Negative for back pain.  Skin: Positive for itching and rash.  Neurological: Negative for seizures and headaches.  Psychiatric/Behavioral: Negative for hallucinations.     Physical Exam Updated Vital Signs BP 135/86 (BP Location: Left Arm)   Pulse 82   Temp 98.1 F (36.7 C) (Oral)   Resp 18   Ht 5\' 8"  (1.727 m)   Wt 86.2 kg   SpO2 98%   BMI 28.89 kg/m   Physical Exam  Constitutional: He is oriented to person, place, and time. He appears well-developed.  HENT:  Head: Normocephalic.  Eyes: Conjunctivae and EOM are normal. No scleral icterus.  Neck: Neck supple. No thyromegaly present.  Cardiovascular: Normal rate and regular rhythm. Exam reveals no gallop and no friction rub.  No murmur heard. Pulmonary/Chest: No stridor. He has no wheezes. He has no rales. He exhibits no tenderness.  Abdominal: He exhibits no distension. There is no tenderness. There is no rebound.  Musculoskeletal: Normal range of motion. He exhibits no edema.  Lymphadenopathy:    He has no cervical adenopathy.  Neurological: He is oriented to person, place, and time. He exhibits normal muscle tone. Coordination normal.  Skin: No rash noted. There is erythema.  Psychiatric: He has a normal mood and affect. His behavior is normal.     ED Treatments / Results  Labs (all labs ordered are listed, but only abnormal results are displayed) Labs Reviewed - No data to display  EKG None  Radiology No results found.  Procedures Procedures (including critical care time)  Medications Ordered in ED Medications  diphenhydrAMINE  (BENADRYL) injection 25 mg (25 mg Intravenous Given 09/23/18 1146)  methylPREDNISolone sodium succinate (SOLU-MEDROL) 125 mg/2 mL injection 125 mg (125 mg Intravenous Given 09/23/18 1146)  famotidine (PEPCID) IVPB 20 mg premix (0 mg Intravenous Stopped 09/23/18 1220)     Initial Impression / Assessment and Plan / ED Course  I have reviewed the triage vital signs and the nursing notes.  Pertinent labs & imaging results that were available during my care of the patient were reviewed by me and considered in my medical decision making (see chart for details).    Patient improved with Benadryl and Pepcid.  He will be discharged home with Benadryl and Pepcid and follow-up with his doctor as needed   Final Clinical Impressions(s) / ED Diagnoses  Final diagnoses:  Allergic reaction, initial encounter    ED Discharge Orders         Ordered    famotidine (PEPCID) 20 MG tablet  2 times daily     09/23/18 1445           Milton Ferguson, MD 09/23/18 1449

## 2018-09-25 ENCOUNTER — Other Ambulatory Visit: Payer: Self-pay

## 2018-09-25 ENCOUNTER — Encounter (HOSPITAL_COMMUNITY): Payer: Self-pay | Admitting: Emergency Medicine

## 2018-09-25 ENCOUNTER — Emergency Department (HOSPITAL_COMMUNITY)
Admission: EM | Admit: 2018-09-25 | Discharge: 2018-09-25 | Disposition: A | Discharge status: Home / Self Care | Payer: Medicare Other | Attending: Emergency Medicine | Admitting: Emergency Medicine

## 2018-09-25 DIAGNOSIS — R112 Nausea with vomiting, unspecified: Secondary | ICD-10-CM | POA: Diagnosis not present

## 2018-09-25 DIAGNOSIS — Z79899 Other long term (current) drug therapy: Secondary | ICD-10-CM | POA: Diagnosis not present

## 2018-09-25 DIAGNOSIS — I10 Essential (primary) hypertension: Secondary | ICD-10-CM | POA: Diagnosis not present

## 2018-09-25 DIAGNOSIS — N289 Disorder of kidney and ureter, unspecified: Secondary | ICD-10-CM

## 2018-09-25 DIAGNOSIS — R197 Diarrhea, unspecified: Secondary | ICD-10-CM | POA: Diagnosis not present

## 2018-09-25 DIAGNOSIS — L509 Urticaria, unspecified: Secondary | ICD-10-CM | POA: Diagnosis not present

## 2018-09-25 DIAGNOSIS — Z87891 Personal history of nicotine dependence: Secondary | ICD-10-CM | POA: Insufficient documentation

## 2018-09-25 LAB — CBC WITH DIFFERENTIAL/PLATELET
ABS IMMATURE GRANULOCYTES: 0.05 10*3/uL (ref 0.00–0.07)
Basophils Absolute: 0 10*3/uL (ref 0.0–0.1)
Basophils Relative: 0 %
Eosinophils Absolute: 0.2 10*3/uL (ref 0.0–0.5)
Eosinophils Relative: 2 %
HCT: 56.4 % — ABNORMAL HIGH (ref 39.0–52.0)
HEMOGLOBIN: 18.7 g/dL — AB (ref 13.0–17.0)
Immature Granulocytes: 0 %
Lymphocytes Relative: 9 %
Lymphs Abs: 1.2 10*3/uL (ref 0.7–4.0)
MCH: 28.8 pg (ref 26.0–34.0)
MCHC: 33.2 g/dL (ref 30.0–36.0)
MCV: 86.9 fL (ref 80.0–100.0)
Monocytes Absolute: 0.6 10*3/uL (ref 0.1–1.0)
Monocytes Relative: 4 %
NEUTROS ABS: 11.8 10*3/uL — AB (ref 1.7–7.7)
Neutrophils Relative %: 85 %
Platelets: 319 10*3/uL (ref 150–400)
RBC: 6.49 MIL/uL — ABNORMAL HIGH (ref 4.22–5.81)
RDW: 14 % (ref 11.5–15.5)
WBC: 13.9 10*3/uL — ABNORMAL HIGH (ref 4.0–10.5)
nRBC: 0 % (ref 0.0–0.2)

## 2018-09-25 LAB — BASIC METABOLIC PANEL
Anion gap: 11 (ref 5–15)
BUN: 17 mg/dL (ref 8–23)
CHLORIDE: 102 mmol/L (ref 98–111)
CO2: 23 mmol/L (ref 22–32)
Calcium: 9.7 mg/dL (ref 8.9–10.3)
Creatinine, Ser: 1.69 mg/dL — ABNORMAL HIGH (ref 0.61–1.24)
GFR calc Af Amer: 47 mL/min — ABNORMAL LOW (ref >60)
GFR calc non Af Amer: 41 mL/min — ABNORMAL LOW (ref >60)
Glucose, Bld: 165 mg/dL — ABNORMAL HIGH (ref 70–99)
Potassium: 3.9 mmol/L (ref 3.5–5.1)
SODIUM: 136 mmol/L (ref 135–145)

## 2018-09-25 MED ORDER — DOXEPIN HCL 10 MG PO CAPS
10.0000 mg | ORAL_CAPSULE | ORAL | Status: AC
  Administered 2018-09-25: 10 mg via ORAL
  Filled 2018-09-25 (×2): qty 1

## 2018-09-25 MED ORDER — DOXEPIN HCL 10 MG PO CAPS: 10.0000 mg | ORAL_CAPSULE | Freq: Three times a day (TID) | ORAL | 0 refills | Status: DC

## 2018-09-25 MED ORDER — METHYLPREDNISOLONE 32 MG PO TABS: 32.0000 mg | ORAL_TABLET | Freq: Every day | ORAL | 0 refills | Status: DC

## 2018-09-25 MED ORDER — FAMOTIDINE IN NACL 20-0.9 MG/50ML-% IV SOLN
20.0000 mg | Freq: Once | INTRAVENOUS | Status: AC
  Administered 2018-09-25: 20 mg via INTRAVENOUS
  Filled 2018-09-25: qty 50

## 2018-09-25 MED ORDER — ONDANSETRON HCL 4 MG/2ML IJ SOLN
4.0000 mg | Freq: Once | INTRAMUSCULAR | Status: AC
  Administered 2018-09-25: 4 mg via INTRAVENOUS
  Filled 2018-09-25: qty 2

## 2018-09-25 MED ORDER — METHYLPREDNISOLONE SODIUM SUCC 125 MG IJ SOLR
125.0000 mg | Freq: Once | INTRAMUSCULAR | Status: AC
  Administered 2018-09-25: 125 mg via INTRAVENOUS
  Filled 2018-09-25: qty 2

## 2018-09-25 MED ORDER — ONDANSETRON HCL 4 MG PO TABS: 4.0000 mg | ORAL_TABLET | Freq: Four times a day (QID) | ORAL | 0 refills | Status: DC | PRN

## 2018-09-25 MED ORDER — LOPERAMIDE HCL 2 MG PO CAPS
4.0000 mg | ORAL_CAPSULE | Freq: Once | ORAL | Status: AC
  Administered 2018-09-25: 4 mg via ORAL
  Filled 2018-09-25: qty 2

## 2018-09-25 MED ORDER — SODIUM CHLORIDE 0.9 % IV BOLUS
1000.0000 mL | Freq: Once | INTRAVENOUS | Status: AC
  Administered 2018-09-25: 1000 mL via INTRAVENOUS

## 2018-09-25 NOTE — ED Triage Notes (Signed)
Patient was seen in ED on Wednesday for allergic reaction, started taking prescriptions yesterday, and know having N/V/D, abdominal pain and rash.

## 2018-09-25 NOTE — ED Provider Notes (Signed)
Pt received at sign out. See previous EDP note for full H&P/MDM. Pt states he "feels better" and wants to leave right now. Pt has climbed off the stretcher by himself, gotten himself dressed, and is walking around his exam room wanting to leave. Pt has tol PO well while in the ED without N/V. No stooling while in the ED. Return precautions given. Will d/c stable.   BP 122/75 (BP Location: Right Arm)   Pulse 82   Temp 98.7 F (37.1 C) (Oral)   Resp 14   Ht 5\' 8"  (1.727 m)   Wt 86.2 kg   SpO2 98%   BMI 28.89 kg/m     Francine Graven, DO 09/25/18 4010

## 2018-09-25 NOTE — ED Provider Notes (Signed)
[facility] EMERGENCY DEPARTMENT Provider Note   CSN: [id number] Arrival date & time: 09/25/18  0543     History   Chief Complaint Chief Complaint  Patient presents with  . Allergic Reaction    HPI Jonathan Gibson is a 67 y.o. male.   The history is provided by the patient.  He has history of hypertension, hyperlipidemia and comes in complaining of recurrence of hives, plus onset of vomiting and diarrhea.  He had been in the emergency department 2 days ago with hives and was feeling better when he left.  Yesterday, hives started to come back and he started having nausea, vomiting, diarrhea.  He estimates 15 episodes of emesis.  He also estimates similar number of episodes of diarrhea.  He still has a feeling of nausea and impending diarrhea.  He denies fever, chills, sweats.  Denies any difficulty breathing or swallowing.  He does state that diphenhydramine is no longer effective for his episodes of hives.  He had also been given famotidine, but states that it made his stomach hurt.  Of note, he states that he is allergic to prednisone, but can take prednisolone.  Past Medical History:  Diagnosis Date  . Allergy   . Anxiety   . Arthritis   . Cancer ([place])    skin  . Cataract   . GERD (gastroesophageal reflux disease)   . Heart murmur   . Hyperlipidemia   . Hypertension   . Nephrolithiasis   . Peripheral neuropathy     Patient Active Problem List   Diagnosis Date Noted  . Allergy to alpha-gal 08/19/2017  . Coronary atherosclerosis 06/04/2010  . Hyperlipidemia 06/01/2010  . Essential hypertension 06/01/2010  . Esophageal reflux 06/01/2010  . Degenerative joint disease (DJD) of lumbar spine 06/01/2010  . INSOMNIA UNSPECIFIED 06/01/2010    Past Surgical History:  Procedure Laterality Date  . CERVICAL DISCECTOMY    . COLONOSCOPY N/A 06/05/2017   Procedure: COLONOSCOPY;  Surgeon: Rogene Houston, MD;  Location[facility];  Service: Endoscopy;  Laterality: N/A;   1030  . dermatofibrosarcoma    . SPINE SURGERY     neck and low back        Home Medications    Prior to Admission medications   Medication Sig Start Date End Date Taking? Authorizing Provider  diphenhydrAMINE (BENADRYL) 25 MG tablet Take 50 mg by mouth every 4 (four) hours as needed for allergies.   Yes [provider]  EPINEPHrine 0.3 mg/0.3 mL IJ SOAJ injection INJECT INTRAMUSCULARLY AS DIRECTED 03/10/17  Yes [provider]  famotidine (PEPCID) 20 MG tablet Take 1 tablet (20 mg total) by mouth 2 (two) times daily. 09/23/18  Yes Milton Ferguson, MD  fexofenadine (ALLEGRA) 180 MG tablet Take 180 mg by mouth daily.   Yes [provider]  gabapentin (NEURONTIN) 400 MG capsule Take 1 capsule (400 mg total) by mouth 2 (two) times daily. 12/23/17  Yes Raylene Everts, MD  mometasone (NASONEX) 50 MCG/ACT nasal spray Place 2 sprays into the nose daily. 12/23/17  Yes Raylene Everts, MD  naproxen sodium (ANAPROX) 220 MG tablet Take 220 mg by mouth daily as needed (pain).   Yes [provider]  olmesartan (BENICAR) 20 MG tablet TAKE 1 TABLET BY MOUTH ONCE DAILY 04/29/18  Yes Caren Macadam, MD  traZODone (DESYREL) 150 MG tablet Take 1 tablet (150 mg total) by mouth at bedtime. 08/19/17  Yes Raylene Everts, MD  fluticasone [facility]) 50 MCG/ACT nasal  spray Place 2 sprays into both nostrils daily. 12/23/17   Raylene Everts, MD    Family History Family History  Problem Relation Age of Onset  . Hypertension Father   . Heart disease Father   . Arthritis Father   . Diabetes Father   . Kidney disease Father   . Arthritis Mother   . Learning disabilities Son   . Mental retardation Son   . Allergic rhinitis Neg Hx   . Angioedema Neg Hx   . Asthma Neg Hx   . Eczema Neg Hx   . Immunodeficiency Neg Hx   . Urticaria Neg Hx     Social History Social History   Tobacco Use  . Smoking status: Former Smoker    Packs/day: 1.00    Types: Cigarettes     Start date: 10/22/1963    Last attempt to quit: 08/24/1994    Years since quitting: 24.1  . Smokeless tobacco: Never Used  Substance Use Topics  . Alcohol use: No    Alcohol/week: 0.0 standard drinks  . Drug use: No     Allergies   Meloxicam; Ace inhibitors; Gemfibrozil; and Statins   Review of Systems Review of Systems  All other systems reviewed and are negative.    Physical Exam Updated Vital Signs BP 104/85 (BP Location: Right Arm)   Pulse (!) 101   Temp (!) 97.4 F (36.3 C) (Oral)   Resp (!) 21   Ht 5\' 8"  (1.727 m)   Wt 86.2 kg   SpO2 95%   BMI 28.89 kg/m    Physical Exam  Nursing note and vitals reviewed.  67  year old male, resting comfortably and in no acute distress. Vital signs are significant for borderline elevated heart rate and borderline elevated respiratory rate. Oxygen saturation is 95%, which is normal. Head is normocephalic and atraumatic. PERRLA, EOMI. Oropharynx is clear.  There is no pooling of secretions and phonation is normal. Neck is nontender and supple without adenopathy or JVD. Back is nontender and there is no CVA tenderness. Lungs are clear without rales, wheezes, or rhonchi. Chest is nontender. Heart has regular rate and rhythm without murmur. Abdomen is soft, flat, nontender without masses or hepatosplenomegaly and peristalsis is normoactive. Extremities have no cyanosis or edema, full range of motion is present. Skin has diffuse urticarial rash over the trunk, arms, legs with large areas of confluence. Neurologic: Mental status is normal, cranial nerves are intact, there are no motor or sensory deficits.  ED Treatments / Results  Labs (all labs ordered are listed, but only abnormal results are displayed) Labs Reviewed  BASIC METABOLIC PANEL - Abnormal; Notable for the following components:      Result Value   Glucose, Bld 165 (*)    Creatinine, Ser 1.69 (*)    GFR calc non Af Amer 41 (*)    GFR calc Af Amer 47 (*)    All other  components within normal limits  CBC WITH DIFFERENTIAL/PLATELET - Abnormal; Notable for the following components:   WBC 13.9 (*)    RBC 6.49 (*)    Hemoglobin 18.7 (*)    HCT 56.4 (*)    Neutro Abs 11.8 (*)    All other components within normal limits   Procedures Procedures  Medications Ordered in ED Medications  sodium chloride 0.9 % bolus 1,000 mL (1,000 mLs Intravenous New Bag/Given 09/25/18 0636)  doxepin (SINEQUAN) capsule 10 mg (has no administration in time range)  ondansetron (ZOFRAN) injection 4 mg (  4 mg Intravenous Given 09/25/18 2956)  famotidine (PEPCID) IVPB 20 mg premix (0 mg Intravenous Stopped 09/25/18 0713)  methylPREDNISolone sodium succinate (SOLU-MEDROL) 125 mg/2 mL injection 125 mg (125 mg Intravenous Given 09/25/18 0640)  loperamide (IMODIUM) capsule 4 mg (4 mg Oral Given 09/25/18 2130)     Initial Impression / Assessment and Plan / ED Course  I have reviewed the triage vital signs and the nursing notes.  Pertinent lab results that were available during my care of the patient were reviewed by me and considered in my medical decision making (see chart for details).  Recurrent urticaria.  Nausea, vomiting, diarrhea.  Old records are reviewed confirming recent ED visit for urticaria as well as prior ED visits for urticaria.  He was given methylprednisolone and ED visit 2 days ago but not sent home with prescriptions for steroids.  He will be given IV fluids, ondansetron, diphenhydramine, famotidine, methylprednisolone.  He will also be given oral doxepin.  7:34 AM Urticaria has not improved, but nausea and diarrhea have resolved.  Labs show renal insufficiency which is slightly worse than 1 year ago.  No evidence of acute kidney injury.  He will be observed in the ED for an additional hour, and if doing well, be discharged with prescriptions for prednisolone, ondansetron, doxepin and advised to use over-the-counter loperamide for diarrhea.  Case is signed out to Dr.  Thurnell Garbe.  Final Clinical Impressions(s) / ED Diagnoses   Final diagnoses:  Urticaria  Nausea vomiting and diarrhea  Renal insufficiency    ED Discharge Orders         Ordered    methylPREDNISolone (MEDROL) 32 MG tablet  Daily     09/25/18 0732    ondansetron (ZOFRAN) 4 MG tablet  Every 6 hours PRN     09/25/18 0732    doxepin (SINEQUAN) 10 MG capsule  3 times daily     86/57/84 6962          Delora Fuel, MD 95/28/41 [phone]

## 2018-09-25 NOTE — Discharge Instructions (Signed)
Continue to take diphenhydramine as needed.  Take loperamide (Imodium AD) as needed for diarrhea.  Return if symptoms are getting worse.

## 2018-10-13 DIAGNOSIS — N529 Male erectile dysfunction, unspecified: Secondary | ICD-10-CM | POA: Diagnosis not present

## 2018-10-13 DIAGNOSIS — R6882 Decreased libido: Secondary | ICD-10-CM | POA: Diagnosis not present

## 2018-10-13 DIAGNOSIS — E785 Hyperlipidemia, unspecified: Secondary | ICD-10-CM | POA: Diagnosis not present

## 2018-10-13 DIAGNOSIS — I10 Essential (primary) hypertension: Secondary | ICD-10-CM | POA: Diagnosis not present

## 2018-10-29 DIAGNOSIS — J019 Acute sinusitis, unspecified: Secondary | ICD-10-CM | POA: Diagnosis not present

## 2018-11-03 DIAGNOSIS — N529 Male erectile dysfunction, unspecified: Secondary | ICD-10-CM | POA: Diagnosis not present

## 2018-11-03 DIAGNOSIS — E559 Vitamin D deficiency, unspecified: Secondary | ICD-10-CM | POA: Diagnosis not present

## 2018-11-03 DIAGNOSIS — I10 Essential (primary) hypertension: Secondary | ICD-10-CM | POA: Diagnosis not present

## 2018-11-03 DIAGNOSIS — R6882 Decreased libido: Secondary | ICD-10-CM | POA: Diagnosis not present

## 2018-11-24 DIAGNOSIS — M9903 Segmental and somatic dysfunction of lumbar region: Secondary | ICD-10-CM | POA: Diagnosis not present

## 2018-11-24 DIAGNOSIS — S336XXA Sprain of sacroiliac joint, initial encounter: Secondary | ICD-10-CM | POA: Diagnosis not present

## 2018-11-24 DIAGNOSIS — M47816 Spondylosis without myelopathy or radiculopathy, lumbar region: Secondary | ICD-10-CM | POA: Diagnosis not present

## 2018-11-26 DIAGNOSIS — I10 Essential (primary) hypertension: Secondary | ICD-10-CM | POA: Diagnosis not present

## 2018-11-26 DIAGNOSIS — E785 Hyperlipidemia, unspecified: Secondary | ICD-10-CM | POA: Diagnosis not present

## 2018-11-26 DIAGNOSIS — J019 Acute sinusitis, unspecified: Secondary | ICD-10-CM | POA: Diagnosis not present

## 2018-12-01 DIAGNOSIS — E785 Hyperlipidemia, unspecified: Secondary | ICD-10-CM | POA: Diagnosis not present

## 2019-02-16 DIAGNOSIS — M9903 Segmental and somatic dysfunction of lumbar region: Secondary | ICD-10-CM | POA: Diagnosis not present

## 2019-02-16 DIAGNOSIS — S336XXA Sprain of sacroiliac joint, initial encounter: Secondary | ICD-10-CM | POA: Diagnosis not present

## 2019-02-16 DIAGNOSIS — M47816 Spondylosis without myelopathy or radiculopathy, lumbar region: Secondary | ICD-10-CM | POA: Diagnosis not present

## 2019-02-25 ENCOUNTER — Ambulatory Visit (INDEPENDENT_AMBULATORY_CARE_PROVIDER_SITE_OTHER): Payer: PRIVATE HEALTH INSURANCE | Admitting: Nurse Practitioner

## 2019-03-04 DIAGNOSIS — Z23 Encounter for immunization: Secondary | ICD-10-CM | POA: Diagnosis not present

## 2019-03-04 DIAGNOSIS — K219 Gastro-esophageal reflux disease without esophagitis: Secondary | ICD-10-CM | POA: Diagnosis not present

## 2019-03-04 DIAGNOSIS — I10 Essential (primary) hypertension: Secondary | ICD-10-CM | POA: Diagnosis not present

## 2019-03-04 DIAGNOSIS — E785 Hyperlipidemia, unspecified: Secondary | ICD-10-CM | POA: Diagnosis not present

## 2019-05-12 DIAGNOSIS — E785 Hyperlipidemia, unspecified: Secondary | ICD-10-CM | POA: Diagnosis not present

## 2019-05-12 DIAGNOSIS — I10 Essential (primary) hypertension: Secondary | ICD-10-CM | POA: Diagnosis not present

## 2019-05-12 DIAGNOSIS — E559 Vitamin D deficiency, unspecified: Secondary | ICD-10-CM | POA: Diagnosis not present

## 2019-05-14 DIAGNOSIS — S336XXA Sprain of sacroiliac joint, initial encounter: Secondary | ICD-10-CM | POA: Diagnosis not present

## 2019-05-14 DIAGNOSIS — M9903 Segmental and somatic dysfunction of lumbar region: Secondary | ICD-10-CM | POA: Diagnosis not present

## 2019-05-14 DIAGNOSIS — M47816 Spondylosis without myelopathy or radiculopathy, lumbar region: Secondary | ICD-10-CM | POA: Diagnosis not present

## 2019-07-01 ENCOUNTER — Other Ambulatory Visit (INDEPENDENT_AMBULATORY_CARE_PROVIDER_SITE_OTHER): Payer: Self-pay

## 2019-07-01 ENCOUNTER — Telehealth (INDEPENDENT_AMBULATORY_CARE_PROVIDER_SITE_OTHER): Payer: Self-pay

## 2019-07-01 DIAGNOSIS — I10 Essential (primary) hypertension: Secondary | ICD-10-CM

## 2019-07-01 DIAGNOSIS — I2510 Atherosclerotic heart disease of native coronary artery without angina pectoris: Secondary | ICD-10-CM

## 2019-07-01 DIAGNOSIS — R0789 Other chest pain: Secondary | ICD-10-CM

## 2019-07-01 DIAGNOSIS — E785 Hyperlipidemia, unspecified: Secondary | ICD-10-CM

## 2019-07-01 NOTE — Telephone Encounter (Signed)
Pt referral was completed and sent to [facility] today.

## 2019-07-01 NOTE — Progress Notes (Signed)
Pt called stated he was having some pressure in chest when walking for exercise. Pt requested to see Dr Domenic Polite ASAP.

## 2019-07-16 ENCOUNTER — Other Ambulatory Visit: Payer: Self-pay

## 2019-07-16 ENCOUNTER — Telehealth: Payer: Self-pay | Admitting: Cardiovascular Disease

## 2019-07-16 ENCOUNTER — Telehealth: Payer: Self-pay | Admitting: *Deleted

## 2019-07-16 ENCOUNTER — Other Ambulatory Visit: Payer: Self-pay | Admitting: Cardiovascular Disease

## 2019-07-16 ENCOUNTER — Encounter: Payer: Self-pay | Admitting: Cardiovascular Disease

## 2019-07-16 ENCOUNTER — Other Ambulatory Visit (HOSPITAL_COMMUNITY)
Admission: RE | Admit: 2019-07-16 | Discharge: 2019-07-16 | Disposition: A | Discharge status: Home / Self Care | Payer: Medicare Other | Source: Ambulatory Visit | Attending: Cardiovascular Disease | Admitting: Cardiovascular Disease

## 2019-07-16 ENCOUNTER — Ambulatory Visit (INDEPENDENT_AMBULATORY_CARE_PROVIDER_SITE_OTHER): Payer: Medicare Other | Admitting: Cardiovascular Disease

## 2019-07-16 VITALS — BP 160/80 | HR 54 | Temp 97.5°F | Ht 68.0 in | Wt 203.0 lb

## 2019-07-16 DIAGNOSIS — I447 Left bundle-branch block, unspecified: Secondary | ICD-10-CM | POA: Insufficient documentation

## 2019-07-16 DIAGNOSIS — I209 Angina pectoris, unspecified: Secondary | ICD-10-CM

## 2019-07-16 DIAGNOSIS — I10 Essential (primary) hypertension: Secondary | ICD-10-CM | POA: Insufficient documentation

## 2019-07-16 DIAGNOSIS — Z01810 Encounter for preprocedural cardiovascular examination: Secondary | ICD-10-CM | POA: Insufficient documentation

## 2019-07-16 LAB — CBC
HCT: 47.1 % (ref 39.0–52.0)
Hemoglobin: 15.7 g/dL (ref 13.0–17.0)
MCH: 29.7 pg (ref 26.0–34.0)
MCHC: 33.3 g/dL (ref 30.0–36.0)
MCV: 89 fL (ref 80.0–100.0)
Platelets: 180 10*3/uL (ref 150–400)
RBC: 5.29 MIL/uL (ref 4.22–5.81)
RDW: 13.2 % (ref 11.5–15.5)
WBC: 5.7 10*3/uL (ref 4.0–10.5)
nRBC: 0 % (ref 0.0–0.2)

## 2019-07-16 LAB — BASIC METABOLIC PANEL
Anion gap: 11 (ref 5–15)
BUN: 18 mg/dL (ref 8–23)
CO2: 21 mmol/L — ABNORMAL LOW (ref 22–32)
Calcium: 9.2 mg/dL (ref 8.9–10.3)
Chloride: 106 mmol/L (ref 98–111)
Creatinine, Ser: 1.42 mg/dL — ABNORMAL HIGH (ref 0.61–1.24)
GFR calc Af Amer: 58 mL/min — ABNORMAL LOW (ref >60)
GFR calc non Af Amer: 50 mL/min — ABNORMAL LOW (ref >60)
Glucose, Bld: 94 mg/dL (ref 70–99)
Potassium: 4.7 mmol/L (ref 3.5–5.1)
Sodium: 138 mmol/L (ref 135–145)

## 2019-07-16 MED ORDER — ISOSORBIDE MONONITRATE ER 30 MG PO TB24: 30.0000 mg | ORAL_TABLET | Freq: Every day | ORAL | 1 refills | Status: DC

## 2019-07-16 MED ORDER — NITROGLYCERIN 0.4 MG SL SUBL: 0.4000 mg | SUBLINGUAL_TABLET | SUBLINGUAL | 3 refills | Status: DC | PRN

## 2019-07-16 MED ORDER — ASPIRIN EC 81 MG PO TBEC: 81.0000 mg | DELAYED_RELEASE_TABLET | Freq: Every day | ORAL | 3 refills | Status: DC

## 2019-07-16 NOTE — Patient Instructions (Addendum)
Medication Instructions:   Your physician has recommended you make the following change in your medication:   Start aspirin 81 mg by mouth daily  Start isosorbide mononitrate 30 mg by mouth daily  Nitroglycerin 0.4 mg Sublingual tablets sent  Continue all other medications the same  Labwork:  Your physician recommends that you return for lab work in: TODAY pre-cath lab work. (BMET & CBC). You may have this done at [facility] or [facility] in [place].  You will need to go to [facility] entrance on Monday, 07/19/2019 for drive up rapid covid testing  Testing/Procedures: Your physician has requested that you have an echocardiogram. Echocardiography is a painless test that uses sound waves to create images of your heart. It provides your doctor with information about the size and shape of your heart and how well your heart's chambers and valves are working. This procedure takes approximately one hour. There are no restrictions for this procedure.  Your physician has requested that you have a cardiac catheterization. Cardiac catheterization is used to diagnose and/or treat various heart conditions. Doctors may recommend this procedure for a number of different reasons. The most common reason is to evaluate chest pain. Chest pain can be a symptom of coronary artery disease (CAD), and cardiac catheterization can show whether plaque is narrowing or blocking your heart's arteries. This procedure is also used to evaluate the valves, as well as measure the blood flow and oxygen levels in different parts of your heart. For further information please visit [email or web address]. Please follow instruction sheet, as given.  Follow-Up:  Your physician recommends that you schedule a follow-up appointment in: 1 month.  Any Other Special Instructions Will Be Listed Below (If Applicable).  If you need a refill on your cardiac medications before your next appointment, please call your  pharmacy.     [place] [address] Dept: [phone] Loc: [phone]  CARNELL CANTERBERRY  07/16/2019  You are scheduled for a Cardiac Catheterization on Tuesday, September 29 with Dr. Larae Grooms.  1. Please arrive at the [facility] (Main Entrance A) at [facility]: [address] [place], [place] [address] at 7:00 AM (This time is two hours before your procedure to ensure your preparation). Free valet parking service is available.   Special note: Every effort is made to have your procedure done on time. Please understand that emergencies sometimes delay scheduled procedures.  2. Diet: Do not eat solid foods after midnight.  The patient may have clear liquids until 5am upon the day of the procedure.  3. Labs: You will need to have blood drawn on Friday, September 25 at [address], [place]  Open: 7am - 6pm, Sat 8am - 12 noon   Phone: [phone]. You do not need to be fasting. OR [facility]--you will need rapid covid testing on Monday Morning at [facility] drive up entrance. Please stay in your car. You will need to go home and quarantine until your heart cath is completed.  4. Medication instructions in preparation for your procedure:   Contrast Allergy: No   On the morning of your procedure, take your Aspirin 81 mg and any morning medicines NOT listed above.  You may use sips of water.  5. Plan for one night stay--bring personal belongings. 6. Bring a current list of your medications and  current insurance cards. 7. You MUST have a responsible person to drive you home. 8. Someone MUST be with you the first 24 hours after you arrive home or your discharge will be delayed. 9. Please wear clothes that are easy to get on and off and wear slip-on shoes.  Thank you for allowing [place] to care for you!   --  [facility]

## 2019-07-16 NOTE — Progress Notes (Signed)
CARDIOLOGY CONSULT NOTE  Patient ID: Jonathan Gibson MRN: [id number] DOB/AGE: Sep 30, 1951 68 y.o.  Admit date: (Not on file) Primary Physician: [name], MD  Reason for Consultation: Atherosclerosis  HPI: Jonathan Gibson is a 68 y.o. male who is being seen today for the evaluation of atherosclerosis at the request of [name], MD.   I last evaluated him in December 2015 for palpitations.  At that time no arrhythmias were noted with event monitoring and he had normal left ventricular systolic function.  He was supposed to have followed up 1 year later but did not.  He has a history of mild nonobstructive coronary artery disease with a normal GXT in June 2014.  He had mild mitral regurgitation by echocardiogram in November 2015.  For the past 2 months he has been having increasing exertional chest pain.  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  He has precordial pain which radiates down his left arm when he begins to walk.  He had been walking routinely prior to this.  I personally reviewed an ECG performed on 08/25/2019 which demonstrated sinus rhythm.  ECG performed today which I personally reviewed demonstrates sinus bradycardia, 51 bpm, with left bundle branch block.    Allergies  Allergen Reactions  . Meloxicam Other (See Comments)    Abdominal pain  . Ace Inhibitors Cough    REACTION: cough  . Gemfibrozil Other (See Comments)    REACTION: muscle aches  . Statins Other (See Comments)    REACTION: myalgia    Current Outpatient Medications  Medication Sig Dispense Refill  . diphenhydrAMINE (BENADRYL) 25 MG tablet Take 50 mg by mouth every 4 (four) hours as needed for allergies.    Marland Kitchen doxepin (SINEQUAN) 10 MG capsule Take 1 capsule (10 mg total) by mouth 3 (three) times daily. 21 capsule 0  . EPINEPHrine 0.3 mg/0.3 mL IJ SOAJ injection INJECT INTRAMUSCULARLY AS DIRECTED  0  .  fluticasone (FLONASE) 50 MCG/ACT nasal spray Place 2 sprays into both nostrils daily. 16 g 0  . gabapentin (NEURONTIN) 400 MG capsule Take 1 capsule (400 mg total) by mouth 2 (two) times daily. (Patient taking differently: Take 400 mg by mouth daily. ) 180 capsule 0  . mometasone (NASONEX) 50 MCG/ACT nasal spray Place 2 sprays into the nose daily. 17 g 12  . naproxen sodium (ANAPROX) 220 MG tablet Take 220 mg by mouth daily as needed (pain).    Marland Kitchen olmesartan (BENICAR) 20 MG tablet TAKE 1 TABLET BY MOUTH ONCE DAILY 90 tablet 0  . ondansetron (ZOFRAN) 4 MG tablet Take 1 tablet (4 mg total) by mouth every 6 (six) hours as needed. 20 tablet 0  . traZODone (DESYREL) 150 MG tablet Take 1 tablet (150 mg total) by mouth at bedtime. 90 tablet 3   No current facility-administered medications for this visit.     Past Medical History:  Diagnosis Date  . Allergy   . Anxiety   . Arthritis   . Cancer ([place])    skin  . Cataract   . GERD (gastroesophageal reflux disease)   . Heart murmur   . Hyperlipidemia   . Hypertension   . Nephrolithiasis   . Peripheral neuropathy     Past Surgical History:  Procedure Laterality Date  . CERVICAL DISCECTOMY    . COLONOSCOPY N/A 06/05/2017   Procedure: COLONOSCOPY;  Surgeon: Hildred Laser  U, MD;  Location: [facility];  Service: Endoscopy;  Laterality: N/A;  1030  . dermatofibrosarcoma    . SPINE SURGERY     neck and low back    Social History   Socioeconomic History  . Marital status: Married    Spouse name: Olin Hauser  . Number of children: 2  . Years of education: 75  . Highest education level: Not on file  Occupational History  . Occupation: retired  Scientific laboratory technician  . Financial resource strain: Not on file  . Food insecurity    Worry: Not on file    Inability: Not on file  . Transportation needs    Medical: Not on file    Non-medical: Not on file  Tobacco Use  . Smoking status: Former Smoker    Packs/day: 1.00    Types: Cigarettes    Start  date: 10/22/1963    Quit date: 08/24/1994    Years since quitting: 24.9  . Smokeless tobacco: Never Used  Substance and Sexual Activity  . Alcohol use: No    Alcohol/week: 0.0 standard drinks  . Drug use: No  . Sexual activity: Not Currently  Lifestyle  . Physical activity    Days per week: Not on file    Minutes per session: Not on file  . Stress: Not on file  Relationships  . Social Herbalist on phone: Not on file    Gets together: Not on file    Attends religious service: Not on file    Active member of club or organization: Not on file    Attends meetings of clubs or organizations: Not on file    Relationship status: Not on file  . Intimate partner violence    Fear of current or ex partner: Not on file    Emotionally abused: Not on file    Physically abused: Not on file    Forced sexual activity: Not on file  Other Topics Concern  . Not on file  Social History Narrative   Lives with wife Olin Hauser and son [name] is mentally handicapped age 58   Walk for exercise   Shoots pistol and reloads own pistol        No family history of premature CAD in 1st degree relatives.  Current Meds  Medication Sig  . diphenhydrAMINE (BENADRYL) 25 MG tablet Take 50 mg by mouth every 4 (four) hours as needed for allergies.  Marland Kitchen doxepin (SINEQUAN) 10 MG capsule Take 1 capsule (10 mg total) by mouth 3 (three) times daily.  Marland Kitchen EPINEPHrine 0.3 mg/0.3 mL IJ SOAJ injection INJECT INTRAMUSCULARLY AS DIRECTED  . fluticasone (FLONASE) 50 MCG/ACT nasal spray Place 2 sprays into both nostrils daily.  Marland Kitchen gabapentin (NEURONTIN) 400 MG capsule Take 1 capsule (400 mg total) by mouth 2 (two) times daily. (Patient taking differently: Take 400 mg by mouth daily. )  . mometasone (NASONEX) 50 MCG/ACT nasal spray Place 2 sprays into the nose daily.  . naproxen sodium (ANAPROX) 220 MG tablet Take 220 mg by mouth daily as needed (pain).  Marland Kitchen olmesartan (BENICAR) 20 MG tablet TAKE 1 TABLET BY MOUTH  ONCE DAILY  . ondansetron (ZOFRAN) 4 MG tablet Take 1 tablet (4 mg total) by mouth every 6 (six) hours as needed.  . traZODone (DESYREL) 150 MG tablet Take 1 tablet (150 mg total) by mouth at bedtime.      Review of systems complete and found to be negative unless listed above in  HPI    Physical exam Blood pressure (!) 160/80, pulse (!) 54, temperature (!) 97.5 F (36.4 C), height 5\' 8"  (1.727 m), weight 203 lb (92.1 kg), SpO2 98 %. General: NAD Neck: No JVD, no thyromegaly or thyroid nodule.  Lungs: Clear to auscultation bilaterally with normal respiratory effort. CV: Nondisplaced PMI.  Bradycardic, regular rhythm, normal S1/S2, no S3/S4, no murmur.  No peripheral edema.  No carotid bruit.    Abdomen: Soft, nontender, no distention.  Skin: Intact without lesions or rashes.  Neurologic: Alert and oriented x 3.  Psych: Normal affect. Extremities: No clubbing or cyanosis.  HEENT: Normal.   ECG: Most recent ECG reviewed.   Labs: Lab Results  Component Value Date/Time   K 3.9 09/25/2018 06:11 AM   BUN 17 09/25/2018 06:11 AM   CREATININE 1.69 (H) 09/25/2018 06:11 AM   CREATININE 1.22 08/24/2014 12:50 PM   TSH 2.963 08/24/2014 12:50 PM   HGB 18.7 (H) 09/25/2018 06:11 AM     Lipids: Lab Results  Component Value Date/Time   LDLCALC 131 (H) 12/23/2017 08:46 AM   CHOL 196 12/23/2017 08:46 AM   TRIG 168 (H) 12/23/2017 08:46 AM   HDL 34 (L) 12/23/2017 08:46 AM        ASSESSMENT AND PLAN:  1.  Chest pain with left bundle branch block: Symptoms are very suggestive of angina pectoris.  I have instructed him to start taking aspirin 81 mg daily.  I will start Imdur 30 mg daily.  He is bradycardic so I am unable to start beta-blockers.  He will likely require statin therapy. I will order a 2-D echocardiogram with Doppler to evaluate cardiac structure, function, and regional wall motion. I will arrange for cardiac catheterization. Risks and benefits of cardiac catheterization have  been discussed with the patient.  These include bleeding, infection, kidney damage, stroke, heart attack, death.  The patient understands these risks and is willing to proceed.  2.  Hypertension: Blood pressure is elevated.  I will monitor given initiation of Imdur 30 mg.     Disposition: Follow up after cath  Signed: Kate Sable, M.D., F.A.C.C.  07/16/2019, 2:50 PM

## 2019-07-16 NOTE — Telephone Encounter (Signed)
Patient informed and verbalized understanding of plan. 

## 2019-07-16 NOTE — Telephone Encounter (Signed)
°  Precert needed for:  Left heart cath dx: angina pectoris & LBBB Tuesday, 07/20/2019 @9 :00 am with Dr. Irish Lack

## 2019-07-16 NOTE — Telephone Encounter (Signed)
-----   Message from Herminio Commons, MD sent at 07/16/2019  4:20 PM EDT ----- Creatinine is mildly elevated suggestive of chronic kidney disease stage III.  I would recommend pretreatment with IV fluids prior to cath to mitigate risk of contrast mediated nephropathy.

## 2019-07-16 NOTE — Telephone Encounter (Signed)
Heart cath time changed to 10:30 am for hydration. Arrival time 5:30 am.

## 2019-07-16 NOTE — Telephone Encounter (Signed)
°  Precert needed for: 2 D Echo dx: LBBB & angina pectoris   Location: [facility]    Date: Aug 18, 2019

## 2019-07-16 NOTE — Telephone Encounter (Signed)
Patient informed to hold benicar the day before and the day of heart cath. Verbalized understanding.

## 2019-07-16 NOTE — Telephone Encounter (Signed)
Will also need to hold benicar day before and day of heart cath.

## 2019-07-16 NOTE — Telephone Encounter (Signed)
-----   Message from Herminio Commons, MD sent at 07/16/2019  4:10 PM EDT ----- Normal.

## 2019-07-16 NOTE — H&P (View-Only) (Signed)
CARDIOLOGY CONSULT NOTE  Patient ID: Jonathan Gibson MRN: [id number] DOB/AGE: 10-29-1950 68 y.o.  Admit date: (Not on file) Primary Physician: [name], MD  Reason for Consultation: Atherosclerosis  HPI: Jonathan Gibson is a 68 y.o. male who is being seen today for the evaluation of atherosclerosis at the request of [name], MD.   I last evaluated him in December 2015 for palpitations.  At that time no arrhythmias were noted with event monitoring and he had normal left ventricular systolic function.  He was supposed to have followed up 1 year later but did not.  He has a history of mild nonobstructive coronary artery disease with a normal GXT in June 2014.  He had mild mitral regurgitation by echocardiogram in November 2015.  For the past 2 months he has been having increasing exertional chest pain.  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  He has precordial pain which radiates down his left arm when he begins to walk.  He had been walking routinely prior to this.  I personally reviewed an ECG performed on 08/25/2019 which demonstrated sinus rhythm.  ECG performed today which I personally reviewed demonstrates sinus bradycardia, 51 bpm, with left bundle branch block.    Allergies  Allergen Reactions  . Meloxicam Other (See Comments)    Abdominal pain  . Ace Inhibitors Cough    REACTION: cough  . Gemfibrozil Other (See Comments)    REACTION: muscle aches  . Statins Other (See Comments)    REACTION: myalgia    Current Outpatient Medications  Medication Sig Dispense Refill  . diphenhydrAMINE (BENADRYL) 25 MG tablet Take 50 mg by mouth every 4 (four) hours as needed for allergies.    Marland Kitchen doxepin (SINEQUAN) 10 MG capsule Take 1 capsule (10 mg total) by mouth 3 (three) times daily. 21 capsule 0  . EPINEPHrine 0.3 mg/0.3 mL IJ SOAJ injection INJECT INTRAMUSCULARLY AS DIRECTED  0  .  fluticasone (FLONASE) 50 MCG/ACT nasal spray Place 2 sprays into both nostrils daily. 16 g 0  . gabapentin (NEURONTIN) 400 MG capsule Take 1 capsule (400 mg total) by mouth 2 (two) times daily. (Patient taking differently: Take 400 mg by mouth daily. ) 180 capsule 0  . mometasone (NASONEX) 50 MCG/ACT nasal spray Place 2 sprays into the nose daily. 17 g 12  . naproxen sodium (ANAPROX) 220 MG tablet Take 220 mg by mouth daily as needed (pain).    Marland Kitchen olmesartan (BENICAR) 20 MG tablet TAKE 1 TABLET BY MOUTH ONCE DAILY 90 tablet 0  . ondansetron (ZOFRAN) 4 MG tablet Take 1 tablet (4 mg total) by mouth every 6 (six) hours as needed. 20 tablet 0  . traZODone (DESYREL) 150 MG tablet Take 1 tablet (150 mg total) by mouth at bedtime. 90 tablet 3   No current facility-administered medications for this visit.     Past Medical History:  Diagnosis Date  . Allergy   . Anxiety   . Arthritis   . Cancer ([place])    skin  . Cataract   . GERD (gastroesophageal reflux disease)   . Heart murmur   . Hyperlipidemia   . Hypertension   . Nephrolithiasis   . Peripheral neuropathy     Past Surgical History:  Procedure Laterality Date  . CERVICAL DISCECTOMY    . COLONOSCOPY N/A 06/05/2017   Procedure: COLONOSCOPY;  Surgeon: Hildred Laser  U, MD;  Location: [facility];  Service: Endoscopy;  Laterality: N/A;  1030  . dermatofibrosarcoma    . SPINE SURGERY     neck and low back    Social History   Socioeconomic History  . Marital status: Married    Spouse name: Olin Hauser  . Number of children: 2  . Years of education: 30  . Highest education level: Not on file  Occupational History  . Occupation: retired  Scientific laboratory technician  . Financial resource strain: Not on file  . Food insecurity    Worry: Not on file    Inability: Not on file  . Transportation needs    Medical: Not on file    Non-medical: Not on file  Tobacco Use  . Smoking status: Former Smoker    Packs/day: 1.00    Types: Cigarettes    Start  date: 10/22/1963    Quit date: 08/24/1994    Years since quitting: 24.9  . Smokeless tobacco: Never Used  Substance and Sexual Activity  . Alcohol use: No    Alcohol/week: 0.0 standard drinks  . Drug use: No  . Sexual activity: Not Currently  Lifestyle  . Physical activity    Days per week: Not on file    Minutes per session: Not on file  . Stress: Not on file  Relationships  . Social Herbalist on phone: Not on file    Gets together: Not on file    Attends religious service: Not on file    Active member of club or organization: Not on file    Attends meetings of clubs or organizations: Not on file    Relationship status: Not on file  . Intimate partner violence    Fear of current or ex partner: Not on file    Emotionally abused: Not on file    Physically abused: Not on file    Forced sexual activity: Not on file  Other Topics Concern  . Not on file  Social History Narrative   Lives with wife Olin Hauser and son Jonathan Gibson is mentally handicapped age 56   Walk for exercise   Shoots pistol and reloads own pistol        No family history of premature CAD in 1st degree relatives.  Current Meds  Medication Sig  . diphenhydrAMINE (BENADRYL) 25 MG tablet Take 50 mg by mouth every 4 (four) hours as needed for allergies.  Marland Kitchen doxepin (SINEQUAN) 10 MG capsule Take 1 capsule (10 mg total) by mouth 3 (three) times daily.  Marland Kitchen EPINEPHrine 0.3 mg/0.3 mL IJ SOAJ injection INJECT INTRAMUSCULARLY AS DIRECTED  . fluticasone (FLONASE) 50 MCG/ACT nasal spray Place 2 sprays into both nostrils daily.  Marland Kitchen gabapentin (NEURONTIN) 400 MG capsule Take 1 capsule (400 mg total) by mouth 2 (two) times daily. (Patient taking differently: Take 400 mg by mouth daily. )  . mometasone (NASONEX) 50 MCG/ACT nasal spray Place 2 sprays into the nose daily.  . naproxen sodium (ANAPROX) 220 MG tablet Take 220 mg by mouth daily as needed (pain).  Marland Kitchen olmesartan (BENICAR) 20 MG tablet TAKE 1 TABLET BY MOUTH  ONCE DAILY  . ondansetron (ZOFRAN) 4 MG tablet Take 1 tablet (4 mg total) by mouth every 6 (six) hours as needed.  . traZODone (DESYREL) 150 MG tablet Take 1 tablet (150 mg total) by mouth at bedtime.      Review of systems complete and found to be negative unless listed above in  HPI    Physical exam Blood pressure (!) 160/80, pulse (!) 54, temperature (!) 97.5 F (36.4 C), height 5\' 8"  (1.727 m), weight 203 lb (92.1 kg), SpO2 98 %. General: NAD Neck: No JVD, no thyromegaly or thyroid nodule.  Lungs: Clear to auscultation bilaterally with normal respiratory effort. CV: Nondisplaced PMI.  Bradycardic, regular rhythm, normal S1/S2, no S3/S4, no murmur.  No peripheral edema.  No carotid bruit.    Abdomen: Soft, nontender, no distention.  Skin: Intact without lesions or rashes.  Neurologic: Alert and oriented x 3.  Psych: Normal affect. Extremities: No clubbing or cyanosis.  HEENT: Normal.   ECG: Most recent ECG reviewed.   Labs: Lab Results  Component Value Date/Time   K 3.9 09/25/2018 06:11 AM   BUN 17 09/25/2018 06:11 AM   CREATININE 1.69 (H) 09/25/2018 06:11 AM   CREATININE 1.22 08/24/2014 12:50 PM   TSH 2.963 08/24/2014 12:50 PM   HGB 18.7 (H) 09/25/2018 06:11 AM     Lipids: Lab Results  Component Value Date/Time   LDLCALC 131 (H) 12/23/2017 08:46 AM   CHOL 196 12/23/2017 08:46 AM   TRIG 168 (H) 12/23/2017 08:46 AM   HDL 34 (L) 12/23/2017 08:46 AM        ASSESSMENT AND PLAN:  1.  Chest pain with left bundle branch block: Symptoms are very suggestive of angina pectoris.  I have instructed him to start taking aspirin 81 mg daily.  I will start Imdur 30 mg daily.  He is bradycardic so I am unable to start beta-blockers.  He will likely require statin therapy. I will order a 2-D echocardiogram with Doppler to evaluate cardiac structure, function, and regional wall motion. I will arrange for cardiac catheterization. Risks and benefits of cardiac catheterization have  been discussed with the patient.  These include bleeding, infection, kidney damage, stroke, heart attack, death.  The patient understands these risks and is willing to proceed.  2.  Hypertension: Blood pressure is elevated.  I will monitor given initiation of Imdur 30 mg.     Disposition: Follow up after cath  Signed: Kate Sable, M.D., F.A.C.C.  07/16/2019, 2:50 PM

## 2019-07-19 ENCOUNTER — Other Ambulatory Visit: Payer: Self-pay

## 2019-07-19 ENCOUNTER — Telehealth: Payer: Self-pay | Admitting: *Deleted

## 2019-07-19 ENCOUNTER — Other Ambulatory Visit (HOSPITAL_COMMUNITY)
Admission: RE | Admit: 2019-07-19 | Discharge: 2019-07-19 | Disposition: A | Discharge status: Home / Self Care | Payer: Medicare Other | Source: Ambulatory Visit | Attending: Interventional Cardiology | Admitting: Interventional Cardiology

## 2019-07-19 DIAGNOSIS — Z20828 Contact with and (suspected) exposure to other viral communicable diseases: Secondary | ICD-10-CM | POA: Diagnosis not present

## 2019-07-19 DIAGNOSIS — Z01812 Encounter for preprocedural laboratory examination: Secondary | ICD-10-CM | POA: Insufficient documentation

## 2019-07-19 LAB — SARS CORONAVIRUS 2 BY RT PCR (HOSPITAL ORDER, PERFORMED IN ~~LOC~~ HOSPITAL LAB): SARS Coronavirus 2: NEGATIVE

## 2019-07-19 NOTE — Telephone Encounter (Signed)
Pt contacted pre-catheterization scheduled at [facility] for:  Tuesday July 20, 2019 10:30 AM Verified arrival time and place: [facility]) at: 5:30 AM-pre procedure hydration   No solid food after midnight prior to cath, clear liquids until 5 AM day of procedure. Contrast allergy: no  Hold: Benicar-day before and day of procedure  Except hold medications AM meds can be  taken pre-cath with sip of water including: ASA 81 mg   Confirmed patient has responsible person to drive home post procedure and observe 24 hours after arriving home: yes  Currently, due to Covid-19 pandemic, only one support person will be allowed with patient. Must be the same support person for that patient's entire stay, will be screened and required to wear a mask. They will be asked to wait in the waiting room for the duration of the patient's stay.  Patients are required to wear a mask when they enter the hospital.     COVID-19 Pre-Screening Questions:  . In the past 7 to 10 days have you had a cough,  shortness of breath, headache, congestion, fever (100 or greater) body aches, chills, sore throat, or sudden loss of taste or sense of smell? no . Have you been around anyone with known Covid 19? no . Have you been around anyone who is awaiting Covid 19 test results in the past 7 to 10 days? . Have you been around anyone who has been exposed to Covid 19, or has mentioned symptoms of Covid 19 within the past 7 to 10 days?no   I reviewed procedure/mask/visitor instructions, Covid-19 screening questions with patient, he verbalized understanding, thanked me for call.

## 2019-07-20 ENCOUNTER — Encounter (HOSPITAL_COMMUNITY): Payer: Self-pay | Admitting: Interventional Cardiology

## 2019-07-20 ENCOUNTER — Encounter (HOSPITAL_COMMUNITY)
Admission: RE | Disposition: A | Discharge status: Home / Self Care | Payer: Self-pay | Source: Home / Self Care | Attending: Interventional Cardiology

## 2019-07-20 ENCOUNTER — Other Ambulatory Visit: Payer: Self-pay

## 2019-07-20 ENCOUNTER — Ambulatory Visit (HOSPITAL_COMMUNITY)
Admission: RE | Admit: 2019-07-20 | Discharge: 2019-07-20 | Disposition: A | Discharge status: Home / Self Care | Payer: Medicare Other | Attending: Interventional Cardiology | Admitting: Interventional Cardiology

## 2019-07-20 DIAGNOSIS — I447 Left bundle-branch block, unspecified: Secondary | ICD-10-CM | POA: Diagnosis not present

## 2019-07-20 DIAGNOSIS — I10 Essential (primary) hypertension: Secondary | ICD-10-CM | POA: Diagnosis not present

## 2019-07-20 DIAGNOSIS — E785 Hyperlipidemia, unspecified: Secondary | ICD-10-CM | POA: Insufficient documentation

## 2019-07-20 DIAGNOSIS — G629 Polyneuropathy, unspecified: Secondary | ICD-10-CM | POA: Diagnosis not present

## 2019-07-20 DIAGNOSIS — Z7951 Long term (current) use of inhaled steroids: Secondary | ICD-10-CM | POA: Insufficient documentation

## 2019-07-20 DIAGNOSIS — I209 Angina pectoris, unspecified: Secondary | ICD-10-CM

## 2019-07-20 DIAGNOSIS — Z7982 Long term (current) use of aspirin: Secondary | ICD-10-CM | POA: Diagnosis not present

## 2019-07-20 DIAGNOSIS — Z87891 Personal history of nicotine dependence: Secondary | ICD-10-CM | POA: Insufficient documentation

## 2019-07-20 DIAGNOSIS — I25119 Atherosclerotic heart disease of native coronary artery with unspecified angina pectoris: Secondary | ICD-10-CM

## 2019-07-20 DIAGNOSIS — Z79899 Other long term (current) drug therapy: Secondary | ICD-10-CM | POA: Insufficient documentation

## 2019-07-20 DIAGNOSIS — Z888 Allergy status to other drugs, medicaments and biological substances status: Secondary | ICD-10-CM | POA: Insufficient documentation

## 2019-07-20 DIAGNOSIS — F419 Anxiety disorder, unspecified: Secondary | ICD-10-CM | POA: Insufficient documentation

## 2019-07-20 DIAGNOSIS — M1990 Unspecified osteoarthritis, unspecified site: Secondary | ICD-10-CM | POA: Diagnosis not present

## 2019-07-20 DIAGNOSIS — K219 Gastro-esophageal reflux disease without esophagitis: Secondary | ICD-10-CM | POA: Insufficient documentation

## 2019-07-20 HISTORY — PX: LEFT HEART CATH AND CORONARY ANGIOGRAPHY: CATH118249

## 2019-07-20 SURGERY — LEFT HEART CATH AND CORONARY ANGIOGRAPHY
Anesthesia: LOCAL

## 2019-07-20 MED ORDER — HEPARIN (PORCINE) IN NACL 1000-0.9 UT/500ML-% IV SOLN
INTRAVENOUS | Status: AC
  Filled 2019-07-20: qty 1000

## 2019-07-20 MED ORDER — ASPIRIN 81 MG PO CHEW
81.0000 mg | CHEWABLE_TABLET | ORAL | Status: AC
  Administered 2019-07-20: 81 mg via ORAL
  Filled 2019-07-20: qty 1

## 2019-07-20 MED ORDER — VERAPAMIL HCL 2.5 MG/ML IV SOLN
INTRAVENOUS | Status: AC
  Filled 2019-07-20: qty 2

## 2019-07-20 MED ORDER — HEPARIN (PORCINE) IN NACL 1000-0.9 UT/500ML-% IV SOLN
INTRAVENOUS | Status: DC | PRN
  Administered 2019-07-20 (×2): 500 mL

## 2019-07-20 MED ORDER — HEPARIN SODIUM (PORCINE) 1000 UNIT/ML IJ SOLN
INTRAMUSCULAR | Status: AC
  Filled 2019-07-20: qty 1

## 2019-07-20 MED ORDER — SODIUM CHLORIDE 0.9% FLUSH: 3.0000 mL | Freq: Two times a day (BID) | INTRAVENOUS | Status: DC

## 2019-07-20 MED ORDER — MIDAZOLAM HCL 2 MG/2ML IJ SOLN
INTRAMUSCULAR | Status: DC | PRN
  Administered 2019-07-20: 2 mg via INTRAVENOUS

## 2019-07-20 MED ORDER — IOHEXOL 350 MG/ML SOLN
INTRAVENOUS | Status: DC | PRN
  Administered 2019-07-20: 55 mL via INTRA_ARTERIAL

## 2019-07-20 MED ORDER — FENTANYL CITRATE (PF) 100 MCG/2ML IJ SOLN
INTRAMUSCULAR | Status: DC | PRN
  Administered 2019-07-20: 25 ug via INTRAVENOUS

## 2019-07-20 MED ORDER — SODIUM CHLORIDE 0.9% FLUSH: 3.0000 mL | INTRAVENOUS | Status: DC | PRN

## 2019-07-20 MED ORDER — VERAPAMIL HCL 2.5 MG/ML IV SOLN
INTRAVENOUS | Status: DC | PRN
  Administered 2019-07-20: 09:00:00 10 mL via INTRA_ARTERIAL

## 2019-07-20 MED ORDER — SODIUM CHLORIDE 0.9 % IV SOLN: 250.0000 mL | INTRAVENOUS | Status: DC | PRN

## 2019-07-20 MED ORDER — SODIUM CHLORIDE 0.9 % WEIGHT BASED INFUSION
3.0000 mL/kg/h | INTRAVENOUS | Status: AC
  Administered 2019-07-20: 3 mL/kg/h via INTRAVENOUS

## 2019-07-20 MED ORDER — LIDOCAINE HCL (PF) 1 % IJ SOLN
INTRAMUSCULAR | Status: DC | PRN
  Administered 2019-07-20: 2 mL

## 2019-07-20 MED ORDER — FENTANYL CITRATE (PF) 100 MCG/2ML IJ SOLN
INTRAMUSCULAR | Status: AC
  Filled 2019-07-20: qty 2

## 2019-07-20 MED ORDER — SODIUM CHLORIDE 0.9 % WEIGHT BASED INFUSION
1.0000 mL/kg/h | INTRAVENOUS | Status: DC
  Administered 2019-07-20: 1 mL/kg/h via INTRAVENOUS

## 2019-07-20 MED ORDER — MIDAZOLAM HCL 2 MG/2ML IJ SOLN
INTRAMUSCULAR | Status: AC
  Filled 2019-07-20: qty 2

## 2019-07-20 MED ORDER — LIDOCAINE HCL (PF) 1 % IJ SOLN
INTRAMUSCULAR | Status: AC
  Filled 2019-07-20: qty 30

## 2019-07-20 MED ORDER — HEPARIN SODIUM (PORCINE) 1000 UNIT/ML IJ SOLN
INTRAMUSCULAR | Status: DC | PRN
  Administered 2019-07-20: 4500 [IU] via INTRAVENOUS

## 2019-07-20 SURGICAL SUPPLY — 11 items
BAG SNAP BAND KOVER 36X36 (MISCELLANEOUS) ×1 IMPLANT
CATH 5FR JL3.5 JR4 ANG PIG MP (CATHETERS) ×1 IMPLANT
COVER DOME SNAP 22 D (MISCELLANEOUS) ×1 IMPLANT
DEVICE RAD COMP TR BAND LRG (VASCULAR PRODUCTS) ×1 IMPLANT
GLIDESHEATH SLEND SS 6F .021 (SHEATH) ×1 IMPLANT
GUIDEWIRE INQWIRE 1.5J.035X260 (WIRE) IMPLANT
INQWIRE 1.5J .035X260CM (WIRE) ×2
KIT HEART LEFT (KITS) ×2 IMPLANT
PACK CARDIAC CATHETERIZATION (CUSTOM PROCEDURE TRAY) ×2 IMPLANT
TRANSDUCER W/STOPCOCK (MISCELLANEOUS) ×2 IMPLANT
TUBING CIL FLEX 10 FLL-RA (TUBING) ×2 IMPLANT

## 2019-07-20 NOTE — Discharge Instructions (Signed)
Radial Site Care ° °This sheet gives you information about how to care for yourself after your procedure. Your health care provider may also give you more specific instructions. If you have problems or questions, contact your health care provider. °What can I expect after the procedure? °After the procedure, it is common to have: °· Bruising and tenderness at the catheter insertion area. °Follow these instructions at home: °Medicines °· Take over-the-counter and prescription medicines only as told by your health care provider. °Insertion site care °· Follow instructions from your health care provider about how to take care of your insertion site. Make sure you: °? Wash your hands with soap and water before you change your bandage (dressing). If soap and water are not available, use hand sanitizer. °? Change your dressing as told by your health care provider. °? Leave stitches (sutures), skin glue, or adhesive strips in place. These skin closures may need to stay in place for 2 weeks or longer. If adhesive strip edges start to loosen and curl up, you may trim the loose edges. Do not remove adhesive strips completely unless your health care provider tells you to do that. °· Check your insertion site every day for signs of infection. Check for: °? Redness, swelling, or pain. °? Fluid or blood. °? Pus or a bad smell. °? Warmth. °· Do not take baths, swim, or use a hot tub until your health care provider approves. °· You may shower 24-48 hours after the procedure, or as directed by your health care provider. °? Remove the dressing and gently wash the site with plain soap and water. °? Pat the area dry with a clean towel. °? Do not rub the site. That could cause bleeding. °· Do not apply powder or lotion to the site. °Activity ° °· For 24 hours after the procedure, or as directed by your health care provider: °? Do not flex or bend the affected arm. °? Do not push or pull heavy objects with the affected arm. °? Do not  drive yourself home from the hospital or clinic. You may drive 24 hours after the procedure unless your health care provider tells you not to. °? Do not operate machinery or power tools. °· Do not lift anything that is heavier than 10 lb (4.5 kg), or the limit that you are told, until your health care provider says that it is safe. °· Ask your health care provider when it is okay to: °? Return to work or school. °? Resume usual physical activities or sports. °? Resume sexual activity. °General instructions °· If the catheter site starts to bleed, raise your arm and put firm pressure on the site. If the bleeding does not stop, get help right away. This is a medical emergency. °· If you went home on the same day as your procedure, a responsible adult should be with you for the first 24 hours after you arrive home. °· Keep all follow-up visits as told by your health care provider. This is important. °Contact a health care provider if: °· You have a fever. °· You have redness, swelling, or yellow drainage around your insertion site. °Get help right away if: °· You have unusual pain at the radial site. °· The catheter insertion area swells very fast. °· The insertion area is bleeding, and the bleeding does not stop when you hold steady pressure on the area. °· Your arm or hand becomes pale, cool, tingly, or numb. °These symptoms may represent a serious problem   that is an emergency. Do not wait to see if the symptoms will go away. Get medical help right away. Call your local emergency services (911 in the [place] Do not drive yourself to the hospital. °Summary °· After the procedure, it is common to have bruising and tenderness at the site. °· Follow instructions from your health care provider about how to take care of your radial site wound. Check the wound every day for signs of infection. °· Do not lift anything that is heavier than 10 lb (4.5 kg), or the limit that you are told, until your health care provider says  that it is safe. °This information is not intended to replace advice given to you by your health care provider. Make sure you discuss any questions you have with your health care provider. °Document Released: 11/09/2010 Document Revised: 11/12/2017 Document Reviewed: 11/12/2017 °Elsevier Patient Education © 2020 [employer] ° °

## 2019-07-20 NOTE — Interval H&P Note (Signed)
Cath Lab Visit (complete for each Cath Lab visit)  Clinical Evaluation Leading to the Procedure:   ACS: No.  Non-ACS:    Anginal Classification: CCS III  Anti-ischemic medical therapy: Minimal Therapy (1 class of medications)  Non-Invasive Test Results: No non-invasive testing performed  Prior CABG: No previous CABG      History and Physical Interval Note:  07/20/2019 8:38 AM  Jonathan Gibson  has presented today for surgery, with the diagnosis of Angina.  The various methods of treatment have been discussed with the patient and family. After consideration of risks, benefits and other options for treatment, the patient has consented to  Procedure(s): LEFT HEART CATH AND CORONARY ANGIOGRAPHY (N/A) as a surgical intervention.  The patient's history has been reviewed, patient examined, no change in status, stable for surgery.  I have reviewed the patient's chart and labs.  Questions were answered to the patient's satisfaction.     Larae Grooms

## 2019-07-28 ENCOUNTER — Other Ambulatory Visit: Payer: Self-pay | Admitting: Cardiovascular Disease

## 2019-07-28 DIAGNOSIS — R079 Chest pain, unspecified: Secondary | ICD-10-CM

## 2019-08-13 DIAGNOSIS — M9903 Segmental and somatic dysfunction of lumbar region: Secondary | ICD-10-CM | POA: Diagnosis not present

## 2019-08-13 DIAGNOSIS — M47816 Spondylosis without myelopathy or radiculopathy, lumbar region: Secondary | ICD-10-CM | POA: Diagnosis not present

## 2019-08-14 ENCOUNTER — Other Ambulatory Visit (INDEPENDENT_AMBULATORY_CARE_PROVIDER_SITE_OTHER): Payer: Self-pay | Admitting: Internal Medicine

## 2019-08-17 ENCOUNTER — Ambulatory Visit (INDEPENDENT_AMBULATORY_CARE_PROVIDER_SITE_OTHER): Payer: Medicare Other | Admitting: Internal Medicine

## 2019-08-17 ENCOUNTER — Encounter (INDEPENDENT_AMBULATORY_CARE_PROVIDER_SITE_OTHER): Payer: Self-pay | Admitting: Internal Medicine

## 2019-08-17 ENCOUNTER — Other Ambulatory Visit: Payer: Self-pay

## 2019-08-17 VITALS — BP 140/80 | HR 64 | Ht 68.0 in | Wt 202.0 lb

## 2019-08-17 DIAGNOSIS — E782 Mixed hyperlipidemia: Secondary | ICD-10-CM | POA: Diagnosis not present

## 2019-08-17 DIAGNOSIS — Z1159 Encounter for screening for other viral diseases: Secondary | ICD-10-CM

## 2019-08-17 DIAGNOSIS — I10 Essential (primary) hypertension: Secondary | ICD-10-CM | POA: Diagnosis not present

## 2019-08-17 DIAGNOSIS — I2510 Atherosclerotic heart disease of native coronary artery without angina pectoris: Secondary | ICD-10-CM

## 2019-08-17 DIAGNOSIS — I209 Angina pectoris, unspecified: Secondary | ICD-10-CM | POA: Diagnosis not present

## 2019-08-17 NOTE — Progress Notes (Signed)
Wellness Office Visit  Subjective:  Patient ID: Jonathan Gibson, male    DOB: 04-07-1951  Age: 68 y.o. MRN: [id number]  CC: This man comes in for follow-up of hypertension, coronary artery disease, hyperlipidemia. HPI I had referred him to cardiology because he was having what appeared to be exertional angina.  He underwent cardiac catheterization and he showed nonobstructive coronary artery disease and he will be treated medically.  He was started on oral nitrates and this is helped him to some degree.  He initially got a headache with the Imdur but now is tolerating it.  He still gets some chest discomfort but not as badly. His renal function was an issue and he did have cardiac catheterization so we will check this again.  Past Medical History:  Diagnosis Date  . Allergy   . Anxiety   . Arthritis   . Cancer ([place])    skin  . Cataract   . GERD (gastroesophageal reflux disease)   . Heart murmur   . Hyperlipidemia   . Hypertension   . Nephrolithiasis   . Peripheral neuropathy       Family History  Problem Relation Age of Onset  . Hypertension Father   . Heart disease Father   . Arthritis Father   . Diabetes Father   . Kidney disease Father   . Arthritis Mother   . Learning disabilities Son   . Mental retardation Son   . Allergic rhinitis Neg Hx   . Angioedema Neg Hx   . Asthma Neg Hx   . Eczema Neg Hx   . Immunodeficiency Neg Hx   . Urticaria Neg Hx     Social History   Social History Narrative   Lives with wife Jonathan Gibson and son Jonathan Gibson is mentally handicapped age 57   Walk for exercise   Shoots pistol and reloads own pistol        Current Meds  Medication Sig  . aspirin EC 81 MG tablet Take 1 tablet (81 mg total) by mouth daily. (Patient taking differently: Take 81 mg by mouth at bedtime. )  . gabapentin (NEURONTIN) 400 MG capsule Take 1 capsule (400 mg total) by mouth 2 (two) times daily. (Patient taking differently: Take 400 mg by mouth at  bedtime. )  . isosorbide mononitrate (IMDUR) 30 MG 24 hr tablet Take 1 tablet (30 mg total) by mouth daily.  . nitroGLYCERIN (NITROSTAT) 0.4 MG SL tablet Place 1 tablet (0.4 mg total) under the tongue every 5 (five) minutes x 3 doses as needed for chest pain (if no relief after 3 rd dose, proceed to the ED for an evaluation or call 911).  . traZODone (DESYREL) 150 MG tablet Take 1 tablet (150 mg total) by mouth at bedtime.      Objective:   Today's Vitals: BP 140/80   Pulse 64   Ht 5\' 8"  (1.727 m)   Wt 202 lb (91.6 kg)   BMI 30.71 kg/m  Vitals with BMI 08/17/2019 07/20/2019 07/20/2019  Height 5\' 8"  - -  Weight 202 lbs - -  BMI [id number] - -  Systolic [id number]  Diastolic 80 68 70  Pulse 64 53 56     Physical Exam   He looks systemically well.  Systolic blood pressure slightly elevated today.  Alert and orientated without any focal neurological signs.    Assessment   1. Essential hypertension   2. Atherosclerosis of native coronary artery of native heart without  angina pectoris   3. Mixed hyperlipidemia   4. Encounter for hepatitis C screening test for low risk patient       Tests ordered Orders Placed This Encounter  Procedures  . COMPLETE METABOLIC PANEL WITH GFR  . Hepatitis C antibody     Plan: 1. He will continue with antianginal medications. 2. Further recommendations will depend on blood results and he will follow-up with Judson Roch for an annual Medicare wellness visit in January.   No orders of the defined types were placed in this encounter.   [name], MD

## 2019-08-18 ENCOUNTER — Ambulatory Visit (INDEPENDENT_AMBULATORY_CARE_PROVIDER_SITE_OTHER): Payer: Medicare Other

## 2019-08-18 ENCOUNTER — Encounter (INDEPENDENT_AMBULATORY_CARE_PROVIDER_SITE_OTHER): Payer: Self-pay | Admitting: Internal Medicine

## 2019-08-18 DIAGNOSIS — R079 Chest pain, unspecified: Secondary | ICD-10-CM

## 2019-08-18 LAB — COMPLETE METABOLIC PANEL WITH GFR
AG Ratio: 1.9 (calc) (ref 1.0–2.5)
ALT: 16 U/L (ref 9–46)
AST: 19 U/L (ref 10–35)
Albumin: 4.5 g/dL (ref 3.6–5.1)
Alkaline phosphatase (APISO): 67 U/L (ref 35–144)
BUN/Creatinine Ratio: 9 (calc) (ref 6–22)
BUN: 13 mg/dL (ref 7–25)
CO2: 23 mmol/L (ref 20–32)
Calcium: 9.7 mg/dL (ref 8.6–10.3)
Chloride: 105 mmol/L (ref 98–110)
Creat: 1.5 mg/dL — ABNORMAL HIGH (ref 0.70–1.18)
GFR, Est African American: 54 mL/min/{1.73_m2} — ABNORMAL LOW (ref >=60)
GFR, Est Non African American: 46 mL/min/{1.73_m2} — ABNORMAL LOW (ref >=60)
Globulin: 2.4 g/dL (calc) (ref 1.9–3.7)
Glucose, Bld: 115 mg/dL — ABNORMAL HIGH (ref 65–99)
Potassium: 5.1 mmol/L (ref 3.5–5.3)
Sodium: 138 mmol/L (ref 135–146)
Total Bilirubin: 0.6 mg/dL (ref 0.2–1.2)
Total Protein: 6.9 g/dL (ref 6.1–8.1)

## 2019-08-18 LAB — HEPATITIS C ANTIBODY
Hepatitis C Ab: NONREACTIVE
SIGNAL TO CUT-OFF: 0.02 (ref <1.00)

## 2019-08-19 ENCOUNTER — Telehealth: Payer: Self-pay | Admitting: *Deleted

## 2019-08-19 NOTE — Telephone Encounter (Signed)
Notes recorded by Laurine Blazer, LPN on [id number] at 9:29 AM EDT  Patient notified. Copy to pmd. Follow up scheduled for 09/03/2019.  ------   Notes recorded by Herminio Commons, MD on 08/18/2019 at 4:08 PM EDT  Pumping function is normal.

## 2019-09-03 ENCOUNTER — Ambulatory Visit (INDEPENDENT_AMBULATORY_CARE_PROVIDER_SITE_OTHER): Payer: Medicare Other | Admitting: Cardiovascular Disease

## 2019-09-03 ENCOUNTER — Other Ambulatory Visit: Payer: Self-pay

## 2019-09-03 ENCOUNTER — Encounter: Payer: Self-pay | Admitting: Cardiovascular Disease

## 2019-09-03 VITALS — BP 138/82 | HR 61 | Ht 68.0 in | Wt 200.0 lb

## 2019-09-03 DIAGNOSIS — I10 Essential (primary) hypertension: Secondary | ICD-10-CM | POA: Diagnosis not present

## 2019-09-03 DIAGNOSIS — I209 Angina pectoris, unspecified: Secondary | ICD-10-CM

## 2019-09-03 DIAGNOSIS — R079 Chest pain, unspecified: Secondary | ICD-10-CM

## 2019-09-03 NOTE — Patient Instructions (Addendum)
Medication Instructions:    Your physician has recommended you make the following change in your medication:   Stop aspirin  Stop isosorbide mononitrate (imdur)  Continue other medications the same  Labwork:  NONE  Testing/Procedures:  NONE  Follow-Up:  Your physician recommends that you schedule a follow-up appointment in: as needed.  Any Other Special Instructions Will Be Listed Below (If Applicable).  If you need a refill on your cardiac medications before your next appointment, please call your pharmacy.

## 2019-09-03 NOTE — Progress Notes (Signed)
SUBJECTIVE: The patient presents for follow-up after undergoing coronary angiography on 07/20/2019.  This demonstrated nonobstructive coronary disease.  Echocardiogram on 08/18/2019 demonstrated normal LV systolic function, EF 55 to 60%.  He continues to experience exertional dyspnea when walking uphill or if he walks too quickly.  He has chest tightness as well.    Review of Systems: As per "subjective", otherwise negative.  Allergies  Allergen Reactions  . Alpha-Gal Anaphylaxis, Hives and Swelling    Cant eat beef/deer products (can eat pork)  . Meloxicam Other (See Comments)    Abdominal pain  . Ace Inhibitors Cough  . Gemfibrozil Other (See Comments)    muscle aches  . Statins Other (See Comments)    myalgia    Current Outpatient Medications  Medication Sig Dispense Refill  . aspirin EC 81 MG tablet Take 1 tablet (81 mg total) by mouth daily. (Patient taking differently: Take 81 mg by mouth at bedtime. ) 90 tablet 3  . gabapentin (NEURONTIN) 400 MG capsule Take 1 capsule (400 mg total) by mouth 2 (two) times daily. (Patient taking differently: Take 400 mg by mouth at bedtime. ) 180 capsule 0  . isosorbide mononitrate (IMDUR) 30 MG 24 hr tablet Take 1 tablet (30 mg total) by mouth daily. 90 tablet 1  . nitroGLYCERIN (NITROSTAT) 0.4 MG SL tablet Place 1 tablet (0.4 mg total) under the tongue every 5 (five) minutes x 3 doses as needed for chest pain (if no relief after 3 rd dose, proceed to the ED for an evaluation or call 911). 25 tablet 3  . traZODone (DESYREL) 150 MG tablet Take 1 tablet (150 mg total) by mouth at bedtime. 90 tablet 3   No current facility-administered medications for this visit.     Past Medical History:  Diagnosis Date  . Allergy   . Anxiety   . Arthritis   . Cancer ([employer])    skin  . Cataract   . GERD (gastroesophageal reflux disease)   . Heart murmur   . Hyperlipidemia   . Hypertension   . Nephrolithiasis   . Peripheral neuropathy      Past Surgical History:  Procedure Laterality Date  . CERVICAL DISCECTOMY    . COLONOSCOPY N/A 06/05/2017   Procedure: COLONOSCOPY;  Surgeon: Rogene Houston, MD;  Location: [facility];  Service: Endoscopy;  Laterality: N/A;  1030  . dermatofibrosarcoma    . LEFT HEART CATH AND CORONARY ANGIOGRAPHY N/A 07/20/2019   Procedure: LEFT HEART CATH AND CORONARY ANGIOGRAPHY;  Surgeon: Jettie Booze, MD;  Location: [facility];  Service: Cardiovascular;  Laterality: N/A;  . SPINE SURGERY     neck and low back    Social History   Socioeconomic History  . Marital status: Married    Spouse name: Olin Hauser  . Number of children: 2  . Years of education: 62  . Highest education level: Not on file  Occupational History  . Occupation: retired  Scientific laboratory technician  . Financial resource strain: Not on file  . Food insecurity    Worry: Not on file    Inability: Not on file  . Transportation needs    Medical: Not on file    Non-medical: Not on file  Tobacco Use  . Smoking status: Former Smoker    Packs/day: 1.00    Types: Cigarettes    Start date: 10/22/1963    Quit date: 08/24/1994    Years since quitting: 25.0  . Smokeless tobacco:  Never Used  Substance and Sexual Activity  . Alcohol use: No    Alcohol/week: 0.0 standard drinks  . Drug use: No  . Sexual activity: Not Currently  Lifestyle  . Physical activity    Days per week: Not on file    Minutes per session: Not on file  . Stress: Not on file  Relationships  . Social Herbalist on phone: Not on file    Gets together: Not on file    Attends religious service: Not on file    Active member of club or organization: Not on file    Attends meetings of clubs or organizations: Not on file    Relationship status: Not on file  . Intimate partner violence    Fear of current or ex partner: Not on file    Emotionally abused: Not on file    Physically abused: Not on file    Forced sexual activity: Not on file  Other  Topics Concern  . Not on file  Social History Narrative   Lives with wife Olin Hauser and son [name] is mentally handicapped age 19   Walk for exercise   Shoots pistol and reloads own pistol        Vitals:   09/03/19 1330  BP: 138/82  Pulse: 61  SpO2: 97%  Weight: 200 lb (90.7 kg)  Height: 5\' 8"  (1.727 m)    Wt Readings from Last 3 Encounters:  09/03/19 200 lb (90.7 kg)  08/17/19 202 lb (91.6 kg)  07/20/19 200 lb (90.7 kg)     PHYSICAL EXAM General: NAD HEENT: Normal. Neck: No JVD, no thyromegaly. Lungs: Clear to auscultation bilaterally with normal respiratory effort. CV: Regular rate and rhythm, normal S1/S2, no S3/S4, no murmur. No pretibial or periankle edema.   Abdomen: Soft, nontender, no distention.  Neurologic: Alert and oriented.  Psych: Normal affect. Skin: Normal. Musculoskeletal: No gross deformities.      Labs: Lab Results  Component Value Date/Time   K 5.1 08/17/2019 10:56 AM   BUN 13 08/17/2019 10:56 AM   CREATININE 1.50 (H) 08/17/2019 10:56 AM   ALT 16 08/17/2019 10:56 AM   TSH 2.963 08/24/2014 12:50 PM   HGB 15.7 07/16/2019 03:53 PM     Lipids: Lab Results  Component Value Date/Time   LDLCALC 131 (H) 12/23/2017 08:46 AM   CHOL 196 12/23/2017 08:46 AM   TRIG 168 (H) 12/23/2017 08:46 AM   HDL 34 (L) 12/23/2017 08:46 AM       ASSESSMENT AND PLAN: 1.  Chest pain: Symptoms are noncardiac in etiology.  Cardiac catheterization reviewed above with nonobstructive disease.  I will stop aspirin and Imdur.  2.  Hypertension: BP is normal.    Disposition: Follow up as needed   Kate Sable, M.D., F.A.C.C.

## 2019-09-06 ENCOUNTER — Other Ambulatory Visit (INDEPENDENT_AMBULATORY_CARE_PROVIDER_SITE_OTHER): Payer: Self-pay | Admitting: Internal Medicine

## 2019-09-08 ENCOUNTER — Encounter (INDEPENDENT_AMBULATORY_CARE_PROVIDER_SITE_OTHER): Payer: Self-pay | Admitting: Internal Medicine

## 2019-09-08 ENCOUNTER — Other Ambulatory Visit: Payer: Self-pay

## 2019-09-08 ENCOUNTER — Ambulatory Visit (INDEPENDENT_AMBULATORY_CARE_PROVIDER_SITE_OTHER): Payer: Medicare Other | Admitting: Internal Medicine

## 2019-09-08 VITALS — BP 150/90 | HR 72 | Ht 68.0 in | Wt 198.0 lb

## 2019-09-08 DIAGNOSIS — R0789 Other chest pain: Secondary | ICD-10-CM | POA: Diagnosis not present

## 2019-09-08 DIAGNOSIS — I209 Angina pectoris, unspecified: Secondary | ICD-10-CM | POA: Diagnosis not present

## 2019-09-08 DIAGNOSIS — R0600 Dyspnea, unspecified: Secondary | ICD-10-CM | POA: Diagnosis not present

## 2019-09-08 DIAGNOSIS — N1831 Chronic kidney disease, stage 3a: Secondary | ICD-10-CM

## 2019-09-08 DIAGNOSIS — E66811 Obesity, class 1: Secondary | ICD-10-CM

## 2019-09-08 DIAGNOSIS — R0609 Other forms of dyspnea: Secondary | ICD-10-CM

## 2019-09-08 DIAGNOSIS — E669 Obesity, unspecified: Secondary | ICD-10-CM | POA: Diagnosis not present

## 2019-09-08 NOTE — Patient Instructions (Signed)

## 2019-09-08 NOTE — Progress Notes (Signed)
Metrics: Intervention Frequency ACO  Documented Smoking Status Yearly  Screened one or more times in 24 months  Cessation Counseling or  Active cessation medication Past 24 months  Past 24 months   Guideline developer: UpToDate (See UpToDate for funding source) Date Released: 2014       Wellness Office Visit  Subjective:  Patient ID: Jonathan Gibson, male    DOB: 03-29-1951  Age: 68 y.o. MRN: [id number]  CC: This man comes in for follow-up regarding his symptoms of dyspnea on exertion and chest tightness. HPI  I had sent him to cardiology and he has been investigated with cardiac catheterization and echocardiogram.  It was felt by the cardiologist that the chest tightness was noncardiac in origin.  He now comes to see if he can get a referral for pulmonology to further investigate this. He also has chronic kidney disease stage III and I do not see that he has been referred to a nephrologist in the past. He continues now on olmesartan for his hypertension. Past Medical History:  Diagnosis Date  . Allergy   . Anxiety   . Arthritis   . Cancer ([place])    skin  . Cataract   . GERD (gastroesophageal reflux disease)   . Heart murmur   . Hyperlipidemia   . Hypertension   . Nephrolithiasis   . Peripheral neuropathy       Family History  Problem Relation Age of Onset  . Hypertension Father   . Heart disease Father   . Arthritis Father   . Diabetes Father   . Kidney disease Father   . Arthritis Mother   . Learning disabilities Son   . Mental retardation Son   . Allergic rhinitis Neg Hx   . Angioedema Neg Hx   . Asthma Neg Hx   . Eczema Neg Hx   . Immunodeficiency Neg Hx   . Urticaria Neg Hx     Social History   Social History Narrative   Lives with wife Olin Hauser and son [name] is mentally handicapped age 64   Walk for exercise   Shoots pistol and reloads own Neurosurgeon      Social History   Tobacco Use  . Smoking status: Former Smoker    Packs/day: 1.00    Types: Cigarettes    Start date: 10/22/1963    Quit date: 08/24/1994    Years since quitting: 25.0  . Smokeless tobacco: Never Used  Substance Use Topics  . Alcohol use: No    Alcohol/week: 0.0 standard drinks    Current Meds  Medication Sig  . gabapentin (NEURONTIN) 400 MG capsule Take 1 capsule (400 mg total) by mouth at bedtime.  . nitroGLYCERIN (NITROSTAT) 0.4 MG SL tablet Place 1 tablet (0.4 mg total) under the tongue every 5 (five) minutes x 3 doses as needed for chest pain (if no relief after 3 rd dose, proceed to the ED for an evaluation or call 911).  Marland Kitchen olmesartan (BENICAR) 20 MG tablet Take 20 mg by mouth daily.  . traZODone (DESYREL) 150 MG tablet TAKE 1 TABLET BY MOUTH ONCE DAILY FOR SLEEP       Objective:   Today's Vitals: BP (!) 150/90   Pulse 72   Ht 5\' 8"  (1.727 m)   Wt 198 lb (89.8 kg)   BMI 30.11 kg/m  Vitals with BMI 09/08/2019 09/03/2019 08/17/2019  Height 5\' 8"  5\' 8"  5\' 8"   Weight 198 lbs 200 lbs 202 lbs  BMI 30.11  [id number]  Systolic [id number]  Diastolic 90 82 80  Pulse 72 61 64     Physical Exam   He looks systemically well and is obese.  Blood pressure today was elevated but with the cardiologist was in the normal range for his age.  He is alert and orientated without any focal neurological signs.    Assessment   1. Pressure in chest   2. Dyspnea on exertion   3. Stage 3a chronic kidney disease   4. Obesity (BMI 30.0-34.9)       Tests ordered Orders Placed This Encounter  Procedures  . Ambulatory referral to Nephrology  . Ambulatory referral to Pulmonology     Plan: 1. I will refer him to nephrology for his chronic kidney disease, which appears to be stable. 2. I will also refer him to pulmonology for his symptoms of dyspnea and chest tightness to see if there is any etiology from lungs. 3. I also gave him a handout for nutrition to help his obesity.  I believe that if we can reduce his weight, I think his symptoms will  improve dramatically also. 4. Follow-up as scheduled in January.  Today I spent 25 minutes with patient face-to-face, more than 50% of the time was involved in reviewing all his work-up for his symptoms, discussing nutrition.   No orders of the defined types were placed in this encounter.   [name], MD

## 2019-10-19 ENCOUNTER — Other Ambulatory Visit: Payer: Self-pay | Admitting: Nephrology

## 2019-10-19 ENCOUNTER — Other Ambulatory Visit (HOSPITAL_COMMUNITY): Payer: Self-pay | Admitting: Nephrology

## 2019-10-19 DIAGNOSIS — N1831 Chronic kidney disease, stage 3a: Secondary | ICD-10-CM

## 2019-10-21 DIAGNOSIS — I5032 Chronic diastolic (congestive) heart failure: Secondary | ICD-10-CM | POA: Diagnosis not present

## 2019-10-21 DIAGNOSIS — Z79899 Other long term (current) drug therapy: Secondary | ICD-10-CM | POA: Diagnosis not present

## 2019-10-21 DIAGNOSIS — I129 Hypertensive chronic kidney disease with stage 1 through stage 4 chronic kidney disease, or unspecified chronic kidney disease: Secondary | ICD-10-CM | POA: Diagnosis not present

## 2019-10-21 DIAGNOSIS — N1831 Chronic kidney disease, stage 3a: Secondary | ICD-10-CM | POA: Diagnosis not present

## 2019-10-21 DIAGNOSIS — E6609 Other obesity due to excess calories: Secondary | ICD-10-CM | POA: Diagnosis not present

## 2019-10-27 ENCOUNTER — Ambulatory Visit (HOSPITAL_COMMUNITY)
Admission: RE | Admit: 2019-10-27 | Discharge: 2019-10-27 | Disposition: A | Discharge status: Home / Self Care | Payer: Medicare Other | Source: Ambulatory Visit | Attending: Nephrology | Admitting: Nephrology

## 2019-10-27 ENCOUNTER — Other Ambulatory Visit: Payer: Self-pay

## 2019-10-27 DIAGNOSIS — N1831 Chronic kidney disease, stage 3a: Secondary | ICD-10-CM

## 2019-10-29 ENCOUNTER — Other Ambulatory Visit: Payer: Self-pay

## 2019-10-29 ENCOUNTER — Encounter (INDEPENDENT_AMBULATORY_CARE_PROVIDER_SITE_OTHER): Payer: Self-pay | Admitting: Nurse Practitioner

## 2019-10-29 ENCOUNTER — Ambulatory Visit (INDEPENDENT_AMBULATORY_CARE_PROVIDER_SITE_OTHER): Payer: Medicare Other | Admitting: Nurse Practitioner

## 2019-10-29 ENCOUNTER — Other Ambulatory Visit (INDEPENDENT_AMBULATORY_CARE_PROVIDER_SITE_OTHER): Payer: Self-pay | Admitting: Nurse Practitioner

## 2019-10-29 VITALS — BP 139/83 | HR 66

## 2019-10-29 DIAGNOSIS — Z0000 Encounter for general adult medical examination without abnormal findings: Secondary | ICD-10-CM

## 2019-10-29 DIAGNOSIS — I10 Essential (primary) hypertension: Secondary | ICD-10-CM

## 2019-10-29 DIAGNOSIS — R0789 Other chest pain: Secondary | ICD-10-CM

## 2019-10-29 MED ORDER — AMLODIPINE BESYLATE 5 MG PO TABS: 5.0000 mg | ORAL_TABLET | Freq: Every day | ORAL | 3 refills | Status: DC

## 2019-10-29 MED ORDER — NITROGLYCERIN 0.4 MG SL SUBL: 0.4000 mg | SUBLINGUAL_TABLET | SUBLINGUAL | 3 refills | Status: DC | PRN

## 2019-10-29 NOTE — Progress Notes (Signed)
Due to national recommendations of social distancing related to the [place] pandemic, an audio/visual tele-health visit was felt to be the most appropriate encounter type for this patient today. I connected with  Lenora Boys on 10/29/19 utilizing audio/visual technology and verified that I am speaking with the correct person using two identifiers. The patient was located at their home, and I was located at the office of [facility] during the encounter. I discussed the limitations of evaluation and management by telemedicine. The patient expressed understanding and agreed to proceed.      Subjective:   Jonathan Gibson is a 69 y.o. male who presents for Medicare Annual/Subsequent preventive examination.  Review of Systems:  Patient denies fever, worsening fatigue, cough, wheezing, abdominal pain, blood in stool.  He does tell me that he does experience chest pain and shortness of breath with activity.  He tells me he has seen his cardiologist and cardiologist stated that there was no cardiac cause for his pain.  The patient is still concerned that there is something wrong with his heart.  He tells me that the cardiologist stopped his aspirin and Imdur, however the patient tells me he continues to take his Imdur because his blood pressure is better controlled.  He is wondering if there is another heart medication he can be put on if he supposed to stop his Imdur.  It appears that he would like to have further discussion with the cardiologist regarding his chest pain. Cardiac Risk Factors include: advanced age (>23men, >91 women);male gender;hypertension;sedentary lifestyle     Objective:    Vitals: BP 139/83   Pulse 66   There is no height or weight on file to calculate BMI.  Advanced Directives 10/29/2019 07/20/2019 09/25/2018 06/05/2017 05/26/2017  Does Patient Have a Medical Advance Directive? Yes Yes No Yes No  Type of Advance Directive Living will [place];Living will - Living will -  Does patient want to make changes to medical advance directive? - No - Patient declined - - -  Would patient like information on creating a medical advance directive? - - No - Patient declined - -    Tobacco Social History   Tobacco Use  Smoking Status Former Smoker  . Packs/day: 1.00  . Types: Cigarettes  . Start date: 10/22/1963  . Quit date: 08/24/1994  . Years since quitting: 25.1  Smokeless Tobacco Never Used     Counseling given: Not Answered   Clinical Intake:  Pre-visit preparation completed: Yes  Pain : No/denies pain     Diabetes: No  How often do you need to have someone help you when you read instructions, pamphlets, or other written materials from your doctor or pharmacy?: 1 - Never What is the last grade level you completed in school?: 8th grade  Interpreter Needed?: No  Information entered by :: Jeralyn Ruths, NP-C  Past Medical History:  Diagnosis Date  . Allergy   . Anxiety   . Arthritis   . Cancer ([place])    skin  . Cataract   . GERD (gastroesophageal reflux disease)   . Heart murmur   . Hyperlipidemia   . Hypertension   . Nephrolithiasis   . Peripheral neuropathy    Past Surgical History:  Procedure Laterality Date  . CERVICAL DISCECTOMY    . CERVICAL FUSION  2008; 2017  . COLONOSCOPY N/A 06/05/2017   Procedure: COLONOSCOPY;  Surgeon: Rogene Houston, MD;  Location: [facility];  Service: Endoscopy;  Laterality: N/A;  1030  . dermatofibrosarcoma    . LEFT HEART CATH AND CORONARY ANGIOGRAPHY N/A 07/20/2019   Procedure: LEFT HEART CATH AND CORONARY ANGIOGRAPHY;  Surgeon: Jettie Booze, MD;  Location: [facility];  Service: Cardiovascular;  Laterality: N/A;  . SPINE SURGERY     neck and low back   Family History  Problem Relation Age of Onset  . Hypertension Father   . Heart disease Father   . Arthritis Father   . Diabetes Father   . Kidney disease Father   . Arthritis Mother   . Heart  failure Mother   . Learning disabilities Son   . Mental retardation Son   . Heart attack Paternal Grandmother   . Allergic rhinitis Neg Hx   . Angioedema Neg Hx   . Asthma Neg Hx   . Eczema Neg Hx   . Immunodeficiency Neg Hx   . Urticaria Neg Hx    Social History   Socioeconomic History  . Marital status: Married    Spouse name: Jonathan Gibson  . Number of children: 2  . Years of education: 76  . Highest education level: Not on file  Occupational History  . Occupation: retired  Tobacco Use  . Smoking status: Former Smoker    Packs/day: 1.00    Types: Cigarettes    Start date: 10/22/1963    Quit date: 08/24/1994    Years since quitting: 25.1  . Smokeless tobacco: Never Used  Substance and Sexual Activity  . Alcohol use: No    Alcohol/week: 0.0 standard drinks  . Drug use: No  . Sexual activity: Not Currently  Other Topics Concern  . Not on file  Social History Narrative   Lives with wife Jonathan Gibson and son Jonathan Gibson is mentally handicapped age 68   Walk for exercise   Shoots pistol and reloads own Neurosurgeon      Social Determinants of Health   Financial Resource Strain:   . Difficulty of Paying Living Expenses: Not on file  Food Insecurity:   . Worried About Charity fundraiser in the Last Year: Not on file  . Ran Out of Food in the Last Year: Not on file  Transportation Needs:   . Lack of Transportation (Medical): Not on file  . Lack of Transportation (Non-Medical): Not on file  Physical Activity:   . Days of Exercise per Week: Not on file  . Minutes of Exercise per Session: Not on file  Stress:   . Feeling of Stress : Not on file  Social Connections:   . Frequency of Communication with Friends and Family: Not on file  . Frequency of Social Gatherings with Friends and Family: Not on file  . Attends Religious Services: Not on file  . Active Member of Clubs or Organizations: Not on file  . Attends Archivist Meetings: Not on file  . Marital Status: Not on  file    Outpatient Encounter Medications as of 10/29/2019  Medication Sig  . gabapentin (NEURONTIN) 400 MG capsule Take 1 capsule (400 mg total) by mouth at bedtime.  . isosorbide mononitrate (IMDUR) 30 MG 24 hr tablet Take 30 mg by mouth daily.  . nitroGLYCERIN (NITROSTAT) 0.4 MG SL tablet Place 1 tablet (0.4 mg total) under the tongue every 5 (five) minutes x 3 doses as needed for chest pain (if no relief after 3 rd dose, proceed to the ED for an evaluation or call 911).  Marland Kitchen olmesartan (BENICAR)  20 MG tablet Take 20 mg by mouth daily.  . traZODone (DESYREL) 150 MG tablet TAKE 1 TABLET BY MOUTH ONCE DAILY FOR SLEEP  . [DISCONTINUED] nitroGLYCERIN (NITROSTAT) 0.4 MG SL tablet Place 1 tablet (0.4 mg total) under the tongue every 5 (five) minutes x 3 doses as needed for chest pain (if no relief after 3 rd dose, proceed to the ED for an evaluation or call 911).   No facility-administered encounter medications on file as of 10/29/2019.    Activities of Daily Living In your present state of health, do you have any difficulty performing the following activities: 10/29/2019  Hearing? Y  Vision? N  Difficulty concentrating or making decisions? N  Walking or climbing stairs? Y  Dressing or bathing? N  Doing errands, shopping? N  Preparing Food and eating ? N  Using the Toilet? N  In the past six months, have you accidently leaked urine? N  Do you have problems with loss of bowel control? N  Managing your Medications? N  Managing your Finances? N  Housekeeping or managing your Housekeeping? N  Some recent data might be hidden    Patient Care Team: [name], MD as PCP - General (Internal Medicine) Herminio Commons, MD as PCP - Cardiology (Cardiology)   Assessment:   This is a routine wellness examination for [employer].  Exercise Activities and Dietary recommendations Current Exercise Habits: The patient does not participate in regular exercise at present, Exercise limited by: Other  - see comments(intermittent chest pain)  Goals    . Blood Pressure < 140/90       Fall Risk Fall Risk  10/29/2019 08/19/2017  Falls in the past year? 0 No  Number falls in past yr: 0 -  Injury with Fall? 0 -  Follow up Education provided;Falls prevention discussed -   Is the patient's home free of loose throw rugs in walkways, pet beds, electrical cords, etc?   yes      Grab bars in the bathroom? no      Handrails on the stairs?   no      Adequate lighting?   yes  Timed Get Up and Go Performed: Not performed as office visit was conducted remotely  Depression Screen PHQ 2/9 Scores 10/29/2019 08/19/2017  PHQ - 2 Score 0 0    Cognitive Function     6CIT Screen 10/29/2019  What Year? 0 points  What month? 0 points  What time? 0 points  Count back from 20 0 points  Months in reverse 0 points  Repeat phrase 4 points  Total Score 4     There is no immunization history on file for this patient.  Qualifies for Shingles Vaccine?  Yes  Screening Tests Health Maintenance  Topic Date Due  . COLONOSCOPY  06/06/2027  . Hepatitis C Screening  Completed  . INFLUENZA VACCINE  Discontinued  . TETANUS/TDAP  Discontinued  . PNA vac Low Risk Adult  Discontinued   Cancer Screenings: Lung: Low Dose CT Chest recommended if Age 1-80 years, 30 pack-year currently smoking OR have quit w/in 15years. Patient does not qualify. Colorectal: Up-to-date he is due for this again in 2028  Additional Screenings:  Hepatitis C Screening: Completed in 2020 and was negative      Plan:   He would be due for the flu shot, tetanus shot, pneumonia shot, and shingles vaccine.  However due to his alpha gal allergy he is hesitant to have these vaccines administered.  He  would prefer to not have any these administered.  He is due for colon cancer screening again in 2028, he has already undergone abdominal aortic aneurysm screening which was negative.  He is already on hepatitis C screening which is  negative.  He seems to be a low risk for falls at this time.  Depression screening was negative today.  He does not qualify for lung cancer screening.  He has a living will in place and does not want make any changes.  He is concerned regarding the nature of his chest pain.  I will reach out to his cardiologist for further information regarding this so I can discuss this with the patient further at next office visit.  I have personally reviewed and noted the following in the patient's chart:   . Medical and social history . Use of alcohol, tobacco or illicit drugs  . Current medications and supplements . Functional ability and status . Nutritional status . Physical activity . Advanced directives . List of other physicians . Hospitalizations, surgeries, and ER visits in previous 12 months . Vitals . Screenings to include cognitive, depression, and falls . Referrals and appointments  In addition, I have reviewed and discussed with patient certain preventive protocols, quality metrics, and best practice recommendations. A written personalized care plan for preventive services as well as general preventive health recommendations were provided to patient.    He will follow-up in approximately 1 month. Ailene Ards, NP  10/29/2019

## 2019-10-29 NOTE — Patient Instructions (Signed)
Thank you for choosing [facility] as your medical provider! If you have any questions or concerns regarding your health care, please do not hesitate to call our office.  You will be due for colon cancer screening again in 2028.     Please follow-up as scheduled in 1 month. We look forward to seeing you again soon!  At [facility] we value your feedback. You may receive a survey about your visit today. Please share your experience as we strive to create trusting relationships with our patients to provide genuine, compassionate, quality care.  We appreciate your understanding and patience as we review any laboratory studies, imaging, and other diagnostic tests that are ordered as we care for you. We do our best to address any and all results in a timely manner. If you do not hear about test results within 1 week, please do not hesitate to contact us. If we referred you to a specialist during your visit or ordered imaging testing, contact the office if you have not been contacted to be scheduled within 1 weeks.  We also encourage the use of MyChart, which contains your medical information for your review as well. If you are not enrolled in this feature, an access code is on this after visit summary for your convenience. Thank you for allowing [place] to be involved in your care.  '

## 2019-10-29 NOTE — Progress Notes (Signed)
I was able to talk with this patient's cardiologist.  He recommended that the patient stop Imdur and consider starting amlodipine for blood pressure control.  I also called this patient and discussed this with him.  He is agreeable to trying this.  I have sent in a prescription for amlodipine 5 mg.  I gave the patient instructions to continue checking his blood pressure at home and if he sees that it is running > 140/90 he should call our office and we can consider increasing the amlodipine.  He tells me he understands.  He will still follow-up in 1 month as scheduled.

## 2019-11-08 ENCOUNTER — Ambulatory Visit (INDEPENDENT_AMBULATORY_CARE_PROVIDER_SITE_OTHER): Payer: PRIVATE HEALTH INSURANCE | Admitting: Nurse Practitioner

## 2019-11-22 ENCOUNTER — Other Ambulatory Visit: Payer: Self-pay

## 2019-11-22 ENCOUNTER — Ambulatory Visit (INDEPENDENT_AMBULATORY_CARE_PROVIDER_SITE_OTHER): Payer: Medicare Other | Admitting: Nurse Practitioner

## 2019-11-22 VITALS — BP 138/74 | HR 60 | Temp 97.5°F | Resp 15 | Ht 68.0 in | Wt 195.0 lb

## 2019-11-22 DIAGNOSIS — E782 Mixed hyperlipidemia: Secondary | ICD-10-CM | POA: Diagnosis not present

## 2019-11-22 DIAGNOSIS — I10 Essential (primary) hypertension: Secondary | ICD-10-CM

## 2019-11-22 DIAGNOSIS — R079 Chest pain, unspecified: Secondary | ICD-10-CM

## 2019-11-22 DIAGNOSIS — N189 Chronic kidney disease, unspecified: Secondary | ICD-10-CM

## 2019-11-22 MED ORDER — AMLODIPINE BESYLATE 10 MG PO TABS: 10.0000 mg | ORAL_TABLET | Freq: Every day | ORAL | 0 refills | Status: DC

## 2019-11-22 NOTE — Patient Instructions (Addendum)
Thank you for choosing [facility] as your medical provider! If you have any questions or concerns regarding your health care, please do not hesitate to call our office.  Increase your amlodipine from 5 mg daily to 10 mg daily.  I have sent a new prescription to your pharmacy.  If you feel unwell on the increased dose of amlodipine, please call this office.  Continue checking her blood pressure at home regularly and keep a log of this.  Your goal blood pressure is to be less than 130/less than 90.  Follow-up with your nephrologist as scheduled.  Once he checks your blood work, please bring a copy of this to our office.  If I need to get any additional blood work I will do this at your next office visit.  Please follow-up as scheduled in 1 month. We look forward to seeing you again soon!   At [facility] we value your feedback. You may receive a survey about your visit today. Please share your experience as we strive to create trusting relationships with our patients to provide genuine, compassionate, quality care.  We appreciate your understanding and patience as we review any laboratory studies, imaging, and other diagnostic tests that are ordered as we care for you. We do our best to address any and all results in a timely manner. If you do not hear about test results within 1 week, please do not hesitate to contact us. If we referred you to a specialist during your visit or ordered imaging testing, contact the office if you have not been contacted to be scheduled within 1 weeks.  We also encourage the use of MyChart, which contains your medical information for your review as well. If you are not enrolled in this feature, an access code is on this after visit summary for your convenience. Thank you for allowing [place] to be involved in your care.

## 2019-11-22 NOTE — Assessment & Plan Note (Signed)
Blood pressure is better, but is still borderline elevated.  We will increase his amlodipine from 5 mg to 10 mg daily.  I did tell him if he feels unwell with the increase in the amlodipine that he should call this office.  He tells me he understands.  He was encouraged to continue keeping a log of his blood pressure, and to bring this to his next office visit.

## 2019-11-22 NOTE — Assessment & Plan Note (Signed)
I was going Estate manager/land agent today, however patient tells me that he is getting blood work drawn at his nephrologist.  She would like to wait to see what blood work is drawn there so that there are no duplicates drawn.  I told him this is acceptable and that he should bring the results of his blood work to [place] at his next office visit here.  We will consider collecting lipid panel if this is not collected by his nephrologist later this week.

## 2019-11-22 NOTE — Assessment & Plan Note (Signed)
This seems to be mildly improved with addition of amlodipine.  As stated above we will increase the dose from 5 mg to 10 mg for better blood pressure control, hopefully his chest pain is also further improved.  I again recommended that we send him to pulmonology for further evaluation as well, but he would like to first increase the dose of his medication and see if this alleviates his pain prior to being referred to pulmonologist.

## 2019-11-22 NOTE — Progress Notes (Signed)
Subjective:  Patient ID: Jonathan Gibson, male    DOB: 09/07/1951  Age: 69 y.o. MRN: [id number]  CC:  Chief Complaint  Patient presents with  . Hypertension    follow up from med change      HPI  This patient arrives for follow-up regarding his high blood pressure, chest pain, history of hyperlipidemia, and chronic kidney disease.  I did a virtual visit with this patient approximately 1 month ago for his annual Medicare wellness visit.  At that time, he also revealed to me that he was having chest pain with activity.  I had reviewed his chart and saw that he had been evaluated via cardiac catheterization and followed up with Dr. Bronson Ing.  Cardiac catheterization did not reveal significant coronary artery blockage, and he was recommended to stop taking Imdur as well as aspirin at that time.  Because he continued to have chest pain, the patient continue to take Imdur.  I did reach out to Dr. Bronson Ing regarding his pain and he recommended that the patient switch to amlodipine for blood pressure control as opposed to taking Imdur.  Since then, the patient has been taking 5 mg of amlodipine daily.  He also checks his blood pressure regularly at home and keep a log of this.  He does bring his log with him today.  On his log his blood pressure is ranging 120s-high130s/mid to high 80s.  He tells me his chest pain has improved mildly, but he still experiences pain and some intermittent palpitations with activity.  I did refer him to pulmonology after his last visit approximately 1 month ago, but he has not set up an appointment for this.  He does not feel that the chest pain is related to his lungs and feels it is more related to his heart.  He also has a history of hyperlipidemia.  I do not see where he is on statin therapy.  Last lipid panel was collected at this office approximately 1 year ago.  At that time his cholesterol panel was within normal limits.  He also has a history of chronic  kidney disease and is followed by a nephrologist.  He tells me he is scheduled to see his nephrologist later this week.  He tells me he is can get blood drawn at that time.    Past Medical History:  Diagnosis Date  . Allergy   . Anxiety   . Arthritis   . Cancer ([place])    skin  . Cataract   . GERD (gastroesophageal reflux disease)   . Heart murmur   . Hyperlipidemia   . Hypertension   . Nephrolithiasis   . Peripheral neuropathy       Family History  Problem Relation Age of Onset  . Hypertension Father   . Heart disease Father   . Arthritis Father   . Diabetes Father   . Kidney disease Father   . Arthritis Mother   . Heart failure Mother   . Learning disabilities Son   . Mental retardation Son   . Heart attack Paternal Grandmother   . Allergic rhinitis Neg Hx   . Angioedema Neg Hx   . Asthma Neg Hx   . Eczema Neg Hx   . Immunodeficiency Neg Hx   . Urticaria Neg Hx     Social History   Social History Narrative   Lives with wife Olin Hauser and son [name] is mentally handicapped age 73   Walk  for exercise   Shoots pistol and reloads own pistol      Social History   Tobacco Use  . Smoking status: Former Smoker    Packs/day: 1.00    Types: Cigarettes    Start date: 10/22/1963    Quit date: 08/24/1994    Years since quitting: 25.2  . Smokeless tobacco: Never Used  Substance Use Topics  . Alcohol use: No    Alcohol/week: 0.0 standard drinks     Current Meds  Medication Sig  . amLODipine (NORVASC) 5 MG tablet Take 1 tablet (5 mg total) by mouth daily.  Marland Kitchen gabapentin (NEURONTIN) 400 MG capsule Take 1 capsule (400 mg total) by mouth at bedtime.  . nitroGLYCERIN (NITROSTAT) 0.4 MG SL tablet Place 1 tablet (0.4 mg total) under the tongue every 5 (five) minutes x 3 doses as needed for chest pain (if no relief after 3 rd dose, proceed to the ED for an evaluation or call 911).  . traZODone (DESYREL) 150 MG tablet TAKE 1 TABLET BY MOUTH ONCE DAILY FOR SLEEP     ROS:  Review of Systems  Constitutional: Negative for fever and malaise/fatigue.  Eyes: Negative for blurred vision.  Respiratory: Negative for cough, shortness of breath and wheezing.   Cardiovascular: Positive for chest pain. Negative for palpitations.  Neurological: Negative for dizziness and headaches.     Objective:   Today's Vitals: BP 138/74   Pulse 60   Temp (!) 97.5 F (36.4 C) (Temporal)   Resp 15   Ht 5\' 8"  (1.727 m)   Wt 195 lb (88.5 kg)   SpO2 97%   BMI 29.65 kg/m  Vitals with BMI 11/22/2019 10/29/2019 09/08/2019  Height 5\' 8"  - 5\' 8"   Weight 195 lbs - 198 lbs  BMI [id number]  Systolic 0000000 [id number]  Diastolic 74 83 90  Pulse 60 66 72     Physical Exam Vitals reviewed.  Constitutional:      Appearance: Normal appearance.  HENT:     Head: Normocephalic and atraumatic.  Neck:     Vascular: No carotid bruit.  Cardiovascular:     Rate and Rhythm: Normal rate and regular rhythm.  Pulmonary:     Effort: Pulmonary effort is normal.     Breath sounds: Normal breath sounds.  Musculoskeletal:     Cervical back: Neck supple.  Skin:    General: Skin is warm and dry.  Neurological:     Mental Status: He is alert and oriented to person, place, and time.  Psychiatric:        Mood and Affect: Mood normal.        Behavior: Behavior normal.        Thought Content: Thought content normal.        Judgment: Judgment normal.          Assessment   No diagnosis found.    Tests ordered No orders of the defined types were placed in this encounter.    Plan: Please see assessment and plan per problem list below.   No orders of the defined types were placed in this encounter.   Patient to follow-up in 1 month.  Ailene Ards, NP

## 2019-11-22 NOTE — Assessment & Plan Note (Signed)
He was encouraged to follow-up with his nephrologist as scheduled.  I asked him to please bring his lab results from his nephrologist with him to his next office visit here.  He tells me he will.

## 2019-11-24 DIAGNOSIS — E211 Secondary hyperparathyroidism, not elsewhere classified: Secondary | ICD-10-CM | POA: Diagnosis not present

## 2019-11-24 DIAGNOSIS — I129 Hypertensive chronic kidney disease with stage 1 through stage 4 chronic kidney disease, or unspecified chronic kidney disease: Secondary | ICD-10-CM | POA: Diagnosis not present

## 2019-11-24 DIAGNOSIS — N1831 Chronic kidney disease, stage 3a: Secondary | ICD-10-CM | POA: Diagnosis not present

## 2019-11-24 DIAGNOSIS — I5032 Chronic diastolic (congestive) heart failure: Secondary | ICD-10-CM | POA: Diagnosis not present

## 2019-11-24 DIAGNOSIS — E6609 Other obesity due to excess calories: Secondary | ICD-10-CM | POA: Diagnosis not present

## 2019-11-24 DIAGNOSIS — N281 Cyst of kidney, acquired: Secondary | ICD-10-CM | POA: Diagnosis not present

## 2019-12-21 ENCOUNTER — Encounter (INDEPENDENT_AMBULATORY_CARE_PROVIDER_SITE_OTHER): Payer: Self-pay | Admitting: Nurse Practitioner

## 2019-12-21 ENCOUNTER — Ambulatory Visit (INDEPENDENT_AMBULATORY_CARE_PROVIDER_SITE_OTHER): Payer: Medicare Other | Admitting: Nurse Practitioner

## 2019-12-21 ENCOUNTER — Other Ambulatory Visit: Payer: Self-pay

## 2019-12-21 VITALS — BP 135/80 | HR 61 | Temp 97.8°F | Ht 68.0 in | Wt 196.8 lb

## 2019-12-21 DIAGNOSIS — I10 Essential (primary) hypertension: Secondary | ICD-10-CM

## 2019-12-21 DIAGNOSIS — R079 Chest pain, unspecified: Secondary | ICD-10-CM

## 2019-12-21 DIAGNOSIS — E782 Mixed hyperlipidemia: Secondary | ICD-10-CM | POA: Diagnosis not present

## 2019-12-21 MED ORDER — AMLODIPINE BESYLATE 5 MG PO TABS: 5.0000 mg | ORAL_TABLET | Freq: Two times a day (BID) | ORAL | 2 refills | Status: DC

## 2019-12-21 NOTE — Patient Instructions (Signed)
Thank you for choosing [facility] as your medical provider! If you have any questions or concerns regarding your health care, please do not hesitate to call our office.  Continue medications as prescribed.  Come back as scheduled in approximately 4 to 6 weeks to discuss your blood work results.  Please call this office any questions or concerns prior to your next appointment.  Please follow-up as scheduled in 1 month. We look forward to seeing you again soon!   At [facility] we value your feedback. You may receive a survey about your visit today. Please share your experience as we strive to create trusting relationships with our patients to provide genuine, compassionate, quality care.  We appreciate your understanding and patience as we review any laboratory studies, imaging, and other diagnostic tests that are ordered as we care for you. We do our best to address any and all results in a timely manner. If you do not hear about test results within 1 week, please do not hesitate to contact us. If we referred you to a specialist during your visit or ordered imaging testing, contact the office if you have not been contacted to be scheduled within 1 weeks.  We also encourage the use of MyChart, which contains your medical information for your review as well. If you are not enrolled in this feature, an access code is on this after visit summary for your convenience. Thank you for allowing [place] to be involved in your care.

## 2019-12-21 NOTE — Progress Notes (Signed)
Subjective:  Patient ID: Jonathan Gibson, male    DOB: 05-08-1951  Age: 69 y.o. MRN: [id number]  CC:  Chief Complaint  Patient presents with  . Other    hypertension, hyperlipidemia, chest pain      HPI  This patient arrives today for the above.  Hypertension/chest pain: We increased him from 5 mg to 10 mg daily of amlodipine at his last office visit.  He tells me he has been taking his amlodipine 5 mg by mouth twice a day as opposed to 10 mg daily since his last office visit.  He does arrive with blood pressure logs from home.  Systolically his blood pressure has been running in the low 130s with a couple of outliers being in the 150s.  Diastolically his blood pressure has mainly been running from 70s to 80s with a couple of outliers being in the 90s from at home.  It does appear that his evening blood pressure checks are slightly more elevated than his morning blood pressure checks.  He tells me that since taking the amlodipine twice a day as opposed to daily his chest pain has essentially resolved and he is able to walk again even on an incline without having significant chest pain or shortness of breath.  Hyperlipidemia: He does bring in copies of blood work that was drawn by his nephrologist back in December 2020.  We held off on collecting lipid panel at his last office visit until we got these results to see if his nephrologist had collected a lipid panel.  It does not appear that the nephrologist did collect lipid panel.  Last lipid panel that we have is from February/03/2019 and it did show total cholesterol of 151, HDL of 32, LDL of 96.  He is not on statin therapy currently.    Past Medical History:  Diagnosis Date  . Allergy   . Anxiety   . Arthritis   . Cancer ([place])    skin  . Cataract   . GERD (gastroesophageal reflux disease)   . Heart murmur   . Hyperlipidemia   . Hypertension   . Nephrolithiasis   . Peripheral neuropathy       Family History  Problem  Relation Age of Onset  . Hypertension Father   . Heart disease Father   . Arthritis Father   . Diabetes Father   . Kidney disease Father   . Arthritis Mother   . Heart failure Mother   . Learning disabilities Son   . Mental retardation Son   . Heart attack Paternal Grandmother   . Allergic rhinitis Neg Hx   . Angioedema Neg Hx   . Asthma Neg Hx   . Eczema Neg Hx   . Immunodeficiency Neg Hx   . Urticaria Neg Hx     Social History   Social History Narrative   Lives with wife Olin Hauser and son [name] is mentally handicapped age 41   Walk for exercise   Shoots pistol and reloads own Neurosurgeon      Social History   Tobacco Use  . Smoking status: Former Smoker    Packs/day: 1.00    Types: Cigarettes    Start date: 10/22/1963    Quit date: 08/24/1994    Years since quitting: 25.3  . Smokeless tobacco: Never Used  Substance Use Topics  . Alcohol use: No    Alcohol/week: 0.0 standard drinks     No outpatient medications have been  marked as taking for the 12/21/19 encounter (Office Visit) with Ailene Ards, NP.    ROS:  Reviewed and negative unless otherwise stated in HPI   Objective:   Today's Vitals: BP 135/80 (BP Location: Left Arm, Patient Position: Sitting, Cuff Size: Normal)   Pulse 61   Temp 97.8 F (36.6 C) (Temporal)   Ht 5' 8" (1.727 m)   Wt 196 lb 12.8 oz (89.3 kg)   SpO2 98%   BMI 29.92 kg/m  Vitals with BMI 12/21/2019 11/22/2019 10/29/2019  Height 5' 8" 5' 8" -  Weight 196 lbs 13 oz 195 lbs -  BMI 81.19 14.78 -  Systolic 295 621 308  Diastolic 80 74 83  Pulse 61 60 66     Physical Exam Vitals reviewed.  Constitutional:      Appearance: Normal appearance.  HENT:     Head: Normocephalic and atraumatic.  Cardiovascular:     Rate and Rhythm: Normal rate and regular rhythm.  Pulmonary:     Effort: Pulmonary effort is normal.     Breath sounds: Normal breath sounds.  Musculoskeletal:     Cervical back: Neck supple.  Skin:    General: Skin is  warm and dry.  Neurological:     Mental Status: He is alert and oriented to person, place, and time.  Psychiatric:        Mood and Affect: Mood normal.        Behavior: Behavior normal.        Thought Content: Thought content normal.        Judgment: Judgment normal.          Assessment   1. Mixed hyperlipidemia   2. Essential hypertension   3. Chest pain, unspecified type       Tests ordered Orders Placed This Encounter  Procedures  . CMP with eGFR([facility])  . Lipid Panel     Plan: 1.  We will collect lipid panel today for further evaluation.  He does tell me that he is fasting this morning.  We will also collect metabolic panel. 2.  Blood pressure is acceptable for now.  He will continue taking his amlodipine 5 mg twice a day. 3.  This seems to be much improved.  No additional work-up will be completed at this time.  Patient does not feel that he needs to be evaluated by pulmonologist, thus will hold off on referral.   Meds ordered this encounter  Medications  . amLODipine (NORVASC) 5 MG tablet    Sig: Take 1 tablet (5 mg total) by mouth in the morning and at bedtime.    Dispense:  60 tablet    Refill:  2    Order Specific Question:   Supervising Provider    Answer:   [name] [[id number]]    Patient to follow-up in 4 to 6 weeks to discuss lipid panel results.  Ailene Ards, NP

## 2019-12-22 LAB — COMPLETE METABOLIC PANEL WITH GFR
AG Ratio: 2 (calc) (ref 1.0–2.5)
ALT: 20 U/L (ref 9–46)
AST: 19 U/L (ref 10–35)
Albumin: 4.3 g/dL (ref 3.6–5.1)
Alkaline phosphatase (APISO): 73 U/L (ref 35–144)
BUN/Creatinine Ratio: 16 (calc) (ref 6–22)
BUN: 20 mg/dL (ref 7–25)
CO2: 25 mmol/L (ref 20–32)
Calcium: 9.2 mg/dL (ref 8.6–10.3)
Chloride: 106 mmol/L (ref 98–110)
Creat: 1.26 mg/dL — ABNORMAL HIGH (ref 0.70–1.18)
GFR, Est African American: 67 mL/min/{1.73_m2} (ref >=60)
GFR, Est Non African American: 57 mL/min/{1.73_m2} — ABNORMAL LOW (ref >=60)
Globulin: 2.2 g/dL (calc) (ref 1.9–3.7)
Glucose, Bld: 124 mg/dL — ABNORMAL HIGH (ref 65–99)
Potassium: 4.4 mmol/L (ref 3.5–5.3)
Sodium: 140 mmol/L (ref 135–146)
Total Bilirubin: 0.4 mg/dL (ref 0.2–1.2)
Total Protein: 6.5 g/dL (ref 6.1–8.1)

## 2019-12-22 LAB — LIPID PANEL
Cholesterol: 171 mg/dL (ref <200)
HDL: 33 mg/dL — ABNORMAL LOW (ref >=40)
LDL Cholesterol (Calc): 113 mg/dL (calc) — ABNORMAL HIGH
Non-HDL Cholesterol (Calc): 138 mg/dL (calc) — ABNORMAL HIGH (ref <130)
Total CHOL/HDL Ratio: 5.2 (calc) — ABNORMAL HIGH (ref <5.0)
Triglycerides: 134 mg/dL (ref <150)

## 2020-01-03 ENCOUNTER — Other Ambulatory Visit (INDEPENDENT_AMBULATORY_CARE_PROVIDER_SITE_OTHER): Payer: Self-pay | Admitting: Internal Medicine

## 2020-01-18 ENCOUNTER — Other Ambulatory Visit: Payer: Self-pay

## 2020-01-18 ENCOUNTER — Ambulatory Visit (INDEPENDENT_AMBULATORY_CARE_PROVIDER_SITE_OTHER): Payer: Medicare Other | Admitting: Nurse Practitioner

## 2020-01-18 ENCOUNTER — Encounter (INDEPENDENT_AMBULATORY_CARE_PROVIDER_SITE_OTHER): Payer: Self-pay | Admitting: Nurse Practitioner

## 2020-01-18 VITALS — BP 137/79 | HR 57 | Temp 97.2°F | Ht 68.0 in | Wt 196.0 lb

## 2020-01-18 DIAGNOSIS — R011 Cardiac murmur, unspecified: Secondary | ICD-10-CM | POA: Diagnosis not present

## 2020-01-18 DIAGNOSIS — E782 Mixed hyperlipidemia: Secondary | ICD-10-CM

## 2020-01-18 DIAGNOSIS — I10 Essential (primary) hypertension: Secondary | ICD-10-CM | POA: Diagnosis not present

## 2020-01-18 DIAGNOSIS — R079 Chest pain, unspecified: Secondary | ICD-10-CM | POA: Diagnosis not present

## 2020-01-18 MED ORDER — PRAVASTATIN SODIUM 40 MG PO TABS: 40.0000 mg | ORAL_TABLET | Freq: Every day | ORAL | 0 refills | Status: DC

## 2020-01-18 NOTE — Progress Notes (Signed)
Subjective:  Patient ID: Jonathan Gibson, male    DOB: 1951-02-24  Age: 69 y.o. MRN: [id number]  CC:  Chief Complaint  Patient presents with  . lab results  . Hypertension  . Hyperlipidemia  . Follow-up      HPI  This patient presents today for the above.  He has a history of hypertension and chest pain.  He was taking amlodipine 5 mg twice a day which was controlling his chest pain, however he started noting some swelling in his lower extremities when taking the amlodipine twice a day.  He cut back to once a day on his own, and the swelling has subsided.  He tells me that the chest pain is also well controlled on the 5 mg once a day.  As for his hyperlipidemia last lipid panel was collected in March of this year and it shows that his LDL is slightly elevated.  Based on the ASCVD risk calculator his score is approximately 25.6%.  He tells me he is on a low-dose aspirin and denies any signs of bleeding.  He has been on a statin in the past, and tells me the only statin that he remembers tolerating his pravastatin.   Past Medical History:  Diagnosis Date  . Allergy   . Anxiety   . Arthritis   . Cancer ([place])    skin  . Cataract   . GERD (gastroesophageal reflux disease)   . Heart murmur   . Hyperlipidemia   . Hypertension   . Nephrolithiasis   . Peripheral neuropathy       Family History  Problem Relation Age of Onset  . Hypertension Father   . Heart disease Father   . Arthritis Father   . Diabetes Father   . Kidney disease Father   . Arthritis Mother   . Heart failure Mother   . Learning disabilities Son   . Mental retardation Son   . Heart attack Paternal Grandmother   . Allergic rhinitis Neg Hx   . Angioedema Neg Hx   . Asthma Neg Hx   . Eczema Neg Hx   . Immunodeficiency Neg Hx   . Urticaria Neg Hx     Social History   Social History Narrative   Lives with wife Olin Hauser and son [name] is mentally handicapped age 48   Walk for exercise    Shoots pistol and reloads own Neurosurgeon      Social History   Tobacco Use  . Smoking status: Former Smoker    Packs/day: 1.00    Types: Cigarettes    Start date: 10/22/1963    Quit date: 08/24/1994    Years since quitting: 25.4  . Smokeless tobacco: Never Used  Substance Use Topics  . Alcohol use: No    Alcohol/week: 0.0 standard drinks     Current Meds  Medication Sig  . amLODipine (NORVASC) 5 MG tablet Take 1 tablet (5 mg total) by mouth in the morning and at bedtime. (Patient taking differently: Take 5 mg by mouth once. )  . aspirin 81 MG chewable tablet Chew 81 mg by mouth daily.  Marland Kitchen gabapentin (NEURONTIN) 400 MG capsule Take 1 capsule by mouth at bedtime  . nitroGLYCERIN (NITROSTAT) 0.4 MG SL tablet Place 1 tablet (0.4 mg total) under the tongue every 5 (five) minutes x 3 doses as needed for chest pain (if no relief after 3 rd dose, proceed to the ED for an evaluation or call 911).  Marland Kitchen  traZODone (DESYREL) 150 MG tablet TAKE 1 TABLET BY MOUTH ONCE DAILY FOR SLEEP    ROS:  Review of Systems  Constitutional: Negative for fever, malaise/fatigue and weight loss.  Eyes: Negative for blurred vision.  Respiratory: Negative for cough, shortness of breath and wheezing.   Cardiovascular: Negative for chest pain and palpitations.  Neurological: Negative for dizziness and headaches.     Objective:   Today's Vitals: BP 137/79 (BP Location: Left Arm, Patient Position: Sitting, Cuff Size: Normal)   Pulse (!) 57   Temp (!) 97.2 F (36.2 C) (Temporal)   Ht 5\' 8"  (1.727 m)   Wt 196 lb (88.9 kg)   SpO2 99%   BMI 29.80 kg/m  Vitals with BMI 01/18/2020 12/21/2019 11/22/2019  Height 5\' 8"  5\' 8"  5\' 8"   Weight 196 lbs 196 lbs 13 oz 195 lbs  BMI 29.81 [id number] [id number]  Systolic 0000000 [id number] 0000000  Diastolic 79 80 74  Pulse 57 61 60     Physical Exam Vitals reviewed.  Constitutional:      Appearance: Normal appearance.  HENT:     Head: Normocephalic and atraumatic.  Cardiovascular:     Rate and  Rhythm: Normal rate and regular rhythm.     Heart sounds: Murmur present.  Pulmonary:     Effort: Pulmonary effort is normal.     Breath sounds: Normal breath sounds.  Musculoskeletal:     Cervical back: Neck supple.  Skin:    General: Skin is warm and dry.  Neurological:     Mental Status: He is alert and oriented to person, place, and time.  Psychiatric:        Mood and Affect: Mood normal.        Behavior: Behavior normal.        Thought Content: Thought content normal.        Judgment: Judgment normal.          Assessment and Plan   1. Mixed hyperlipidemia   2. Murmur   3. Essential hypertension   4. Chest pain, unspecified type      Plan: 1.  I did discuss the ASCVD risk calculator with the patient.  We did look at his risk for today and the impact of starting statin therapy which could reduce his risk from 25% down to 19%.  We also discussed risks associated with statin specifically.  Per shared decision making, the patient would like to start a statin again today.  I have sent this prescription to his pharmacy.  He will return to clinic in approximately 6 to 8 weeks to recheck cholesterol panel as well as metabolic panel.  He will continue to take his low-dose baby aspirin.  I also encouraged him to follow a healthy diet heavy in plants and low in processed foods.  He tells me he understands.  2., 3.,  4.  Murmur noted on exam today.  Patient tells me he has been told he has had a murmur in the past.  He did have a cardiac echocardiogram completed in October 2020.  Some mild regurgitation was noted but no significant regurgitation or stenosis noted on echocardiogram.  He has also undergone cardiac catheterization in the recent past during work-up for his chest pain.  His chest pain and exercise tolerance have improved and appears to be back to his baseline since starting his amlodipine.  Blood pressure is acceptable today on current medication regimen.  For now he will  continue on his amlodipine, no other  work-up will be done right now.  He was encouraged to let me know if his symptoms worsen or change in the future.     Tests ordered No orders of the defined types were placed in this encounter.     Meds ordered this encounter  Medications  . pravastatin (PRAVACHOL) 40 MG tablet    Sig: Take 1 tablet (40 mg total) by mouth daily.    Dispense:  90 tablet    Refill:  0    Order Specific Question:   Supervising Provider    Answer:   [name] [id number]    Patient to follow-up in 6 to 8 weeks, or sooner as needed.  Ailene Ards, NP

## 2020-01-20 ENCOUNTER — Ambulatory Visit (INDEPENDENT_AMBULATORY_CARE_PROVIDER_SITE_OTHER): Payer: Medicare Other | Admitting: Nurse Practitioner

## 2020-02-17 DIAGNOSIS — E6609 Other obesity due to excess calories: Secondary | ICD-10-CM | POA: Diagnosis not present

## 2020-02-17 DIAGNOSIS — N1831 Chronic kidney disease, stage 3a: Secondary | ICD-10-CM | POA: Diagnosis not present

## 2020-02-17 DIAGNOSIS — I129 Hypertensive chronic kidney disease with stage 1 through stage 4 chronic kidney disease, or unspecified chronic kidney disease: Secondary | ICD-10-CM | POA: Diagnosis not present

## 2020-02-17 DIAGNOSIS — I5032 Chronic diastolic (congestive) heart failure: Secondary | ICD-10-CM | POA: Diagnosis not present

## 2020-02-17 DIAGNOSIS — E211 Secondary hyperparathyroidism, not elsewhere classified: Secondary | ICD-10-CM | POA: Diagnosis not present

## 2020-02-17 DIAGNOSIS — N281 Cyst of kidney, acquired: Secondary | ICD-10-CM | POA: Diagnosis not present

## 2020-02-23 DIAGNOSIS — I5032 Chronic diastolic (congestive) heart failure: Secondary | ICD-10-CM | POA: Diagnosis not present

## 2020-02-23 DIAGNOSIS — Z79899 Other long term (current) drug therapy: Secondary | ICD-10-CM | POA: Diagnosis not present

## 2020-02-23 DIAGNOSIS — I129 Hypertensive chronic kidney disease with stage 1 through stage 4 chronic kidney disease, or unspecified chronic kidney disease: Secondary | ICD-10-CM | POA: Diagnosis not present

## 2020-02-23 DIAGNOSIS — E611 Iron deficiency: Secondary | ICD-10-CM | POA: Diagnosis not present

## 2020-02-23 DIAGNOSIS — N390 Urinary tract infection, site not specified: Secondary | ICD-10-CM | POA: Diagnosis not present

## 2020-02-23 DIAGNOSIS — N1831 Chronic kidney disease, stage 3a: Secondary | ICD-10-CM | POA: Diagnosis not present

## 2020-02-23 DIAGNOSIS — E211 Secondary hyperparathyroidism, not elsewhere classified: Secondary | ICD-10-CM | POA: Diagnosis not present

## 2020-02-23 DIAGNOSIS — N281 Cyst of kidney, acquired: Secondary | ICD-10-CM | POA: Diagnosis not present

## 2020-02-24 DIAGNOSIS — E211 Secondary hyperparathyroidism, not elsewhere classified: Secondary | ICD-10-CM | POA: Diagnosis not present

## 2020-02-24 DIAGNOSIS — N281 Cyst of kidney, acquired: Secondary | ICD-10-CM | POA: Diagnosis not present

## 2020-02-24 DIAGNOSIS — I129 Hypertensive chronic kidney disease with stage 1 through stage 4 chronic kidney disease, or unspecified chronic kidney disease: Secondary | ICD-10-CM | POA: Diagnosis not present

## 2020-02-24 DIAGNOSIS — N1831 Chronic kidney disease, stage 3a: Secondary | ICD-10-CM | POA: Diagnosis not present

## 2020-02-24 DIAGNOSIS — Z79899 Other long term (current) drug therapy: Secondary | ICD-10-CM | POA: Diagnosis not present

## 2020-02-24 DIAGNOSIS — I5032 Chronic diastolic (congestive) heart failure: Secondary | ICD-10-CM | POA: Diagnosis not present

## 2020-03-05 ENCOUNTER — Other Ambulatory Visit (INDEPENDENT_AMBULATORY_CARE_PROVIDER_SITE_OTHER): Payer: Self-pay | Admitting: Nurse Practitioner

## 2020-03-15 ENCOUNTER — Encounter (INDEPENDENT_AMBULATORY_CARE_PROVIDER_SITE_OTHER): Payer: Self-pay | Admitting: Nurse Practitioner

## 2020-03-15 ENCOUNTER — Ambulatory Visit (INDEPENDENT_AMBULATORY_CARE_PROVIDER_SITE_OTHER): Payer: Medicare Other | Admitting: Nurse Practitioner

## 2020-03-15 ENCOUNTER — Other Ambulatory Visit: Payer: Self-pay

## 2020-03-15 VITALS — BP 150/89 | HR 55 | Temp 97.6°F | Ht 68.0 in | Wt 196.6 lb

## 2020-03-15 DIAGNOSIS — E785 Hyperlipidemia, unspecified: Secondary | ICD-10-CM

## 2020-03-15 DIAGNOSIS — R0609 Other forms of dyspnea: Secondary | ICD-10-CM

## 2020-03-15 DIAGNOSIS — R0600 Dyspnea, unspecified: Secondary | ICD-10-CM

## 2020-03-15 DIAGNOSIS — N1831 Chronic kidney disease, stage 3a: Secondary | ICD-10-CM | POA: Diagnosis not present

## 2020-03-15 DIAGNOSIS — I10 Essential (primary) hypertension: Secondary | ICD-10-CM

## 2020-03-15 DIAGNOSIS — N3090 Cystitis, unspecified without hematuria: Secondary | ICD-10-CM

## 2020-03-15 DIAGNOSIS — M7989 Other specified soft tissue disorders: Secondary | ICD-10-CM | POA: Diagnosis not present

## 2020-03-15 DIAGNOSIS — R079 Chest pain, unspecified: Secondary | ICD-10-CM | POA: Diagnosis not present

## 2020-03-15 MED ORDER — POTASSIUM CHLORIDE ER 10 MEQ PO TBCR: EXTENDED_RELEASE_TABLET | ORAL | 2 refills | Status: DC

## 2020-03-15 MED ORDER — FUROSEMIDE 20 MG PO TABS: 20.0000 mg | ORAL_TABLET | Freq: Every day | ORAL | 3 refills | Status: DC | PRN

## 2020-03-15 MED ORDER — AMLODIPINE BESYLATE 5 MG PO TABS: 5.0000 mg | ORAL_TABLET | Freq: Every day | ORAL | 0 refills | Status: DC

## 2020-03-15 NOTE — Progress Notes (Signed)
Subjective:  Patient ID: Jonathan Gibson, male    DOB: 03/16/1951  Age: 69 y.o. MRN: [id number]  CC:  Chief Complaint  Patient presents with  . Medication Management    amlodipine  . Hyperlipidemia  . Hypertension  . Joint Swelling      HPI  This patient arrives today for the above.  Hyperlipidemia: He has a history of hyperlipidemia, at our last office visit we did discuss trying pravastatin to treat his cholesterol.  He has tried other statins in the past and is not tolerating them well, and today he tells me that he had significant muscle pain and soreness on the pravastatin.  Thus, he stopped taking it.  Hypertension/Chest Pain/Bilateral Lower Leg Swelling: He is currently taking amlodipine 5 mg daily.  He was taking amlodipine 5 mg twice a day, but has noticed has been having some lower leg edema.  So we cut back to 5 mg daily.  He continues to have some lower leg edema but it is a bit improved.  He is on the amlodipine for treatment of chest pain and hypertension.  Last time he saw cardiology was November 2020.  At that time he had already undergone coronary angiography and cardiac echocardiogram.  These showed nonobstructive coronary artery disease and normal cardiac function on echocardiogram.  His chest pain was thought to be noncardiac in origin, however once he was told to discontinue his Imdur and aspirin, the patient felt his chest pain worsened especially with activity.  I did discuss this with the patient's cardiologist and he recommended trying amlodipine.upon starting the amlodipine the patient's chest pain seem to mostly resolve.  The patient tells me that he was told in the past that he has heart failure, but I do not see this diagnosis listed in his chart.  Today, he tells me that his chest pain is generally controlled on the 5 mg of amlodipine daily, but his blood pressures at home have been running a bit high, he does experience some mild shortness of breath with  physical exertion.  Chronic kidney disease: He is continues to follow-up with his nephrologist, and tells me he will see them again next month.     Past Medical History:  Diagnosis Date  . Allergy   . Anxiety   . Arthritis   . Cancer ([place])    skin  . Cataract   . GERD (gastroesophageal reflux disease)   . Heart murmur   . Hyperlipidemia   . Hypertension   . Nephrolithiasis   . Peripheral neuropathy       Family History  Problem Relation Age of Onset  . Hypertension Father   . Heart disease Father   . Arthritis Father   . Diabetes Father   . Kidney disease Father   . Arthritis Mother   . Heart failure Mother   . Learning disabilities Son   . Mental retardation Son   . Heart attack Paternal Grandmother   . Allergic rhinitis Neg Hx   . Angioedema Neg Hx   . Asthma Neg Hx   . Eczema Neg Hx   . Immunodeficiency Neg Hx   . Urticaria Neg Hx     Social History   Social History Narrative   Lives with wife Olin Hauser and son [name] is mentally handicapped age 12   Walk for exercise   Shoots pistol and reloads own Neurosurgeon      Social History   Tobacco Use  .  Smoking status: Former Smoker    Packs/day: 1.00    Types: Cigarettes    Start date: 10/22/1963    Quit date: 08/24/1994    Years since quitting: 25.5  . Smokeless tobacco: Never Used  Substance Use Topics  . Alcohol use: No    Alcohol/week: 0.0 standard drinks     Current Meds  Medication Sig  . amLODipine (NORVASC) 5 MG tablet Take 1 tablet (5 mg total) by mouth daily.  Marland Kitchen aspirin 81 MG chewable tablet Chew 81 mg by mouth daily.  . ferrous sulfate 325 (65 FE) MG tablet Take 325 mg by mouth 3 (three) times daily with meals.  . gabapentin (NEURONTIN) 400 MG capsule Take 1 capsule by mouth at bedtime  . traZODone (DESYREL) 150 MG tablet TAKE 1 TABLET BY MOUTH ONCE DAILY FOR SLEEP  . [DISCONTINUED] amLODipine (NORVASC) 5 MG tablet Take 1 tablet (5 mg total) by mouth in the morning and at bedtime.  (Patient taking differently: Take 5 mg by mouth once. )    ROS:  Review of Systems  Constitutional: Negative for malaise/fatigue and weight loss.  Respiratory: Positive for shortness of breath. Negative for cough.   Cardiovascular: Positive for chest pain and leg swelling. Negative for orthopnea.  Gastrointestinal: Negative.   Neurological: Negative.      Objective:   Today's Vitals: BP (!) 150/89 (BP Location: Left Arm, Patient Position: Sitting, Cuff Size: Normal)   Pulse (!) 55   Temp 97.6 F (36.4 C) (Temporal)   Ht _0  (1.727 m)   Wt 196 lb 9.6 oz (89.2 kg)   SpO2 98%   BMI 29.89 kg/m  Vitals with BMI 03/15/2020 01/18/2020 12/21/2019  Height _1  _2  _3   Weight 196 lbs 10 oz 196 lbs 196 lbs 13 oz  BMI 29.9 08.65 78.46  Systolic 962 952 841  Diastolic 89 79 80  Pulse 55 57 61     Physical Exam Vitals reviewed.  Constitutional:      Appearance: Normal appearance.  HENT:     Head: Normocephalic and atraumatic.  Cardiovascular:     Rate and Rhythm: Normal rate and regular rhythm.     Heart sounds: Murmur present.  Pulmonary:     Effort: Pulmonary effort is normal.     Breath sounds: Normal breath sounds.  Musculoskeletal:     Cervical back: Neck supple.     Right lower leg: 1+ Pitting Edema present.     Left lower leg: 1+ Pitting Edema present.  Skin:    General: Skin is warm and dry.  Neurological:     Mental Status: He is alert and oriented to person, place, and time.  Psychiatric:        Mood and Affect: Mood normal.        Behavior: Behavior normal.        Thought Content: Thought content normal.        Judgment: Judgment normal.          Assessment and Plan   1. Leg swelling   2. Stage 3a chronic kidney disease   3. Cystitis   4. Chest pain, unspecified type   5. Dyspnea on exertion   6. Hyperlipidemia, unspecified hyperlipidemia type   7. Essential hypertension      Plan: 1.,  4.,  5.,  7.  I will collect some blood work for  further evaluation including BNP today.  Patient would like to stay on amlodipine if possible because he  believes it helps to control his chest pain.  I will prescribe him an as needed furosemide with potassium supplement for leg swelling.  He will return in about 2 weeks for blood work recheck and to see if this has helped resolve the swelling and also help better control his blood pressure.  If not may need to consider changing antihypertensive agent.  Further recommendations will be made once blood work returns.  2.  We will collect urine to check for albuminuria, he was encouraged to follow-up with his nephrologist as scheduled.  We will also check CMP today.  3.  During review of systems he did mention that he was recently treated for a urinary tract infection, and does have some residual symptoms.  I will collect urine today and send off for evaluation.  6.  He will remain off of pravastatin due to not tolerating side effects.  We will continue to monitor his lipid panel intermittently.   Tests ordered Orders Placed This Encounter  Procedures  . Brain Natriuretic Peptide  . CMP with eGFR([facility])  . Microalbumin/Creatinine Ratio, Urine  . Urinalysis with Culture Reflex      Meds ordered this encounter  Medications  . furosemide (LASIX) 20 MG tablet    Sig: Take 1 tablet (20 mg total) by mouth daily as needed for fluid or edema.    Dispense:  30 tablet    Refill:  3    Order Specific Question:   Supervising Provider    Answer:   Hurshel Party C [[id number]]  . potassium chloride (KLOR-CON) 10 MEQ tablet    Sig: Take 1 tablet by mouth when you take furosemide    Dispense:  30 tablet    Refill:  2    Order Specific Question:   Supervising Provider    Answer:   Hurshel Party C [[id number]]  . amLODipine (NORVASC) 5 MG tablet    Sig: Take 1 tablet (5 mg total) by mouth daily.    Dispense:  90 tablet    Refill:  0    Order Specific Question:   Supervising Provider    Answer:   [name] [[id number]]    Patient to follow-up in 2 weeks  Ailene Ards, NP

## 2020-03-16 ENCOUNTER — Other Ambulatory Visit (INDEPENDENT_AMBULATORY_CARE_PROVIDER_SITE_OTHER): Payer: Self-pay | Admitting: Nurse Practitioner

## 2020-03-16 DIAGNOSIS — R300 Dysuria: Secondary | ICD-10-CM

## 2020-03-16 LAB — COMPLETE METABOLIC PANEL WITH GFR
AG Ratio: 2 (calc) (ref 1.0–2.5)
ALT: 16 U/L (ref 9–46)
AST: 18 U/L (ref 10–35)
Albumin: 4.3 g/dL (ref 3.6–5.1)
Alkaline phosphatase (APISO): 81 U/L (ref 35–144)
BUN/Creatinine Ratio: 12 (calc) (ref 6–22)
BUN: 16 mg/dL (ref 7–25)
CO2: 26 mmol/L (ref 20–32)
Calcium: 9 mg/dL (ref 8.6–10.3)
Chloride: 107 mmol/L (ref 98–110)
Creat: 1.33 mg/dL — ABNORMAL HIGH (ref 0.70–1.18)
GFR, Est African American: 62 mL/min/{1.73_m2} (ref >=60)
GFR, Est Non African American: 54 mL/min/{1.73_m2} — ABNORMAL LOW (ref >=60)
Globulin: 2.1 g/dL (calc) (ref 1.9–3.7)
Glucose, Bld: 117 mg/dL — ABNORMAL HIGH (ref 65–99)
Potassium: 4.2 mmol/L (ref 3.5–5.3)
Sodium: 140 mmol/L (ref 135–146)
Total Bilirubin: 0.5 mg/dL (ref 0.2–1.2)
Total Protein: 6.4 g/dL (ref 6.1–8.1)

## 2020-03-16 LAB — NO CULTURE INDICATED

## 2020-03-16 LAB — URINALYSIS W MICROSCOPIC + REFLEX CULTURE
Bacteria, UA: NONE SEEN /HPF
Bilirubin Urine: NEGATIVE
Glucose, UA: NEGATIVE
Hgb urine dipstick: NEGATIVE
Hyaline Cast: NONE SEEN /LPF
Ketones, ur: NEGATIVE
Leukocyte Esterase: NEGATIVE
Nitrites, Initial: NEGATIVE
Protein, ur: NEGATIVE
RBC / HPF: NONE SEEN /HPF (ref 0–2)
Specific Gravity, Urine: 1.02 (ref 1.001–1.03)
Squamous Epithelial / HPF: NONE SEEN /HPF (ref <=5)
WBC, UA: NONE SEEN /HPF (ref 0–5)
pH: 6 (ref 5.0–8.0)

## 2020-03-16 LAB — MICROALBUMIN / CREATININE URINE RATIO
Creatinine, Urine: 154 mg/dL (ref 20–320)
Microalb Creat Ratio: 15 mcg/mg creat (ref <30)
Microalb, Ur: 2.3 mg/dL

## 2020-03-16 LAB — BRAIN NATRIURETIC PEPTIDE: Brain Natriuretic Peptide: 18 pg/mL (ref <100)

## 2020-03-23 DIAGNOSIS — R350 Frequency of micturition: Secondary | ICD-10-CM | POA: Diagnosis not present

## 2020-03-23 DIAGNOSIS — R351 Nocturia: Secondary | ICD-10-CM | POA: Diagnosis not present

## 2020-04-04 ENCOUNTER — Other Ambulatory Visit (HOSPITAL_COMMUNITY): Payer: Self-pay | Admitting: Nephrology

## 2020-04-04 ENCOUNTER — Other Ambulatory Visit: Payer: Self-pay | Admitting: Nephrology

## 2020-04-04 DIAGNOSIS — N1831 Chronic kidney disease, stage 3a: Secondary | ICD-10-CM

## 2020-04-04 DIAGNOSIS — I10 Essential (primary) hypertension: Secondary | ICD-10-CM

## 2020-04-04 DIAGNOSIS — N281 Cyst of kidney, acquired: Secondary | ICD-10-CM

## 2020-04-06 ENCOUNTER — Ambulatory Visit (INDEPENDENT_AMBULATORY_CARE_PROVIDER_SITE_OTHER): Payer: Medicare Other | Admitting: Nurse Practitioner

## 2020-04-06 ENCOUNTER — Other Ambulatory Visit: Payer: Self-pay

## 2020-04-06 ENCOUNTER — Encounter (INDEPENDENT_AMBULATORY_CARE_PROVIDER_SITE_OTHER): Payer: Self-pay | Admitting: Nurse Practitioner

## 2020-04-06 VITALS — BP 130/70 | HR 53 | Temp 97.3°F | Resp 18 | Ht 68.0 in | Wt 197.0 lb

## 2020-04-06 DIAGNOSIS — I10 Essential (primary) hypertension: Secondary | ICD-10-CM

## 2020-04-06 DIAGNOSIS — N1831 Chronic kidney disease, stage 3a: Secondary | ICD-10-CM | POA: Diagnosis not present

## 2020-04-06 DIAGNOSIS — R300 Dysuria: Secondary | ICD-10-CM

## 2020-04-06 DIAGNOSIS — R0789 Other chest pain: Secondary | ICD-10-CM

## 2020-04-06 DIAGNOSIS — M7989 Other specified soft tissue disorders: Secondary | ICD-10-CM

## 2020-04-06 LAB — COMPLETE METABOLIC PANEL WITH GFR
AG Ratio: 1.9 (calc) (ref 1.0–2.5)
ALT: 21 U/L (ref 9–46)
AST: 18 U/L (ref 10–35)
Albumin: 4.2 g/dL (ref 3.6–5.1)
Alkaline phosphatase (APISO): 85 U/L (ref 35–144)
BUN: 17 mg/dL (ref 7–25)
CO2: 26 mmol/L (ref 20–32)
Calcium: 9.1 mg/dL (ref 8.6–10.3)
Chloride: 106 mmol/L (ref 98–110)
Creat: 1.15 mg/dL (ref 0.70–1.18)
GFR, Est African American: 74 mL/min/{1.73_m2} (ref >=60)
GFR, Est Non African American: 64 mL/min/{1.73_m2} (ref >=60)
Globulin: 2.2 g/dL (calc) (ref 1.9–3.7)
Glucose, Bld: 139 mg/dL — ABNORMAL HIGH (ref 65–99)
Potassium: 4.3 mmol/L (ref 3.5–5.3)
Sodium: 138 mmol/L (ref 135–146)
Total Bilirubin: 0.4 mg/dL (ref 0.2–1.2)
Total Protein: 6.4 g/dL (ref 6.1–8.1)

## 2020-04-06 MED ORDER — NITROGLYCERIN 0.4 MG SL SUBL: 0.4000 mg | SUBLINGUAL_TABLET | SUBLINGUAL | 3 refills | Status: DC | PRN

## 2020-04-06 NOTE — Progress Notes (Signed)
Subjective:  Patient ID: Jonathan Gibson, male    DOB: [birth year]  Age: 69 y.o. MRN: [id number]  CC:  Chief Complaint  Patient presents with  . Hypertension  . Other    Dysuria, leg swelling      HPI  This patient arrives today for follow-up of the above.  Hypertension/leg swelling: He has a history of hypertension and continues on amlodipine 5 mg daily for his hypertension but also for his chest pain.  Chest pain has been worked up by cardiology in the recent past, and was thought to be noncardiac in origin.  However, with addition of amlodipine chest pain seem to improve, but he did experience some lower leg swelling.  Due to this he was also started on furosemide as needed for leg swelling and a BNP was checked at his last office visit.  He also takes a potassium pill whenever he takes his furosemide.  BMP came back within normal limits.  Since that time, he tells me his leg swelling as improved and he is not experiencing any chest pain.  He has been monitoring his blood pressure at home and tells me that it has been fairly normal.  Dysuria: He was also experiencing some recurrent dysuria.  He was evaluated by urology as urinary tract infection was ruled out.  He tells me their evaluation did not show any significant abnormalities, but they feel that he may have had a stone in his bladder.   He tells me he is due to follow-up with his nephrologist for his chronic kidney disease in approximately 6 weeks.     Past Medical History:  Diagnosis Date  . Allergy   . Anxiety   . Arthritis   . Cancer ([place])    skin  . Cataract   . GERD (gastroesophageal reflux disease)   . Heart murmur   . Hyperlipidemia   . Hypertension   . Nephrolithiasis   . Peripheral neuropathy       Family History  Problem Relation Age of Onset  . Hypertension Father   . Heart disease Father   . Arthritis Father   . Diabetes Father   . Kidney disease Father   . Arthritis Mother   . Heart  failure Mother   . Learning disabilities Son   . Mental retardation Son   . Heart attack Paternal Grandmother   . Allergic rhinitis Neg Hx   . Angioedema Neg Hx   . Asthma Neg Hx   . Eczema Neg Hx   . Immunodeficiency Neg Hx   . Urticaria Neg Hx     Social History   Social History Narrative   Lives with wife Olin Hauser and son [name] is mentally handicapped age 77   Walk for exercise   Shoots pistol and reloads own Neurosurgeon      Social History   Tobacco Use  . Smoking status: Former Smoker    Packs/day: 1.00    Types: Cigarettes    Start date: 10/22/1963    Quit date: 08/24/1994    Years since quitting: 25.6  . Smokeless tobacco: Never Used  Substance Use Topics  . Alcohol use: No    Alcohol/week: 0.0 standard drinks     Current Meds  Medication Sig  . amLODipine (NORVASC) 5 MG tablet Take 1 tablet (5 mg total) by mouth daily.  Marland Kitchen aspirin 81 MG chewable tablet Chew 81 mg by mouth daily.  . ferrous sulfate 325 (65 FE)  MG tablet Take 325 mg by mouth 3 (three) times daily with meals.  . furosemide (LASIX) 20 MG tablet Take 1 tablet (20 mg total) by mouth daily as needed for fluid or edema.  . gabapentin (NEURONTIN) 400 MG capsule Take 1 capsule by mouth at bedtime (Patient taking differently: Take 400 mg by mouth at bedtime. )  . nitroGLYCERIN (NITROSTAT) 0.4 MG SL tablet Place 1 tablet (0.4 mg total) under the tongue every 5 (five) minutes x 3 doses as needed for chest pain (if no relief after 3 rd dose, proceed to the ED for an evaluation or call 911).  . potassium chloride (KLOR-CON) 10 MEQ tablet Take 1 tablet by mouth when you take furosemide  . traZODone (DESYREL) 150 MG tablet TAKE 1 TABLET BY MOUTH ONCE DAILY FOR SLEEP  . [DISCONTINUED] nitroGLYCERIN (NITROSTAT) 0.4 MG SL tablet Place 1 tablet (0.4 mg total) under the tongue every 5 (five) minutes x 3 doses as needed for chest pain (if no relief after 3 rd dose, proceed to the ED for an evaluation or call 911).     ROS:  Review of Systems  Respiratory: Positive for shortness of breath (with significant activity).   Cardiovascular: Negative for chest pain.  Genitourinary: Negative for dysuria and frequency.  Neurological: Negative for dizziness and headaches.     Objective:   Today's Vitals: BP 130/70 (BP Location: Left Arm, Patient Position: Sitting, Cuff Size: Normal)   Pulse (!) 53   Temp (!) 97.3 F (36.3 C) (Temporal)   Resp 18   Ht '5\' 8"'  (1.727 m)   Wt 197 lb (89.4 kg)   SpO2 98%   BMI 29.95 kg/m  Vitals with BMI 04/06/2020 03/15/2020 01/18/2020  Height '5\' 8"'  '5\' 8"'  '5\' 8"'   Weight 197 lbs 196 lbs 10 oz 196 lbs  BMI 29.96 83.3 82.50  Systolic 539 767 341  Diastolic 70 89 79  Pulse 53 55 57     Physical Exam Vitals reviewed.  Constitutional:      Appearance: Normal appearance.  HENT:     Head: Normocephalic and atraumatic.  Cardiovascular:     Rate and Rhythm: Normal rate and regular rhythm.     Heart sounds: Murmur heard.   Pulmonary:     Effort: Pulmonary effort is normal.     Breath sounds: Normal breath sounds.  Musculoskeletal:     Cervical back: Neck supple.     Right lower leg: No edema.     Left lower leg: No edema.  Skin:    General: Skin is warm and dry.  Neurological:     Mental Status: He is alert and oriented to person, place, and time.  Psychiatric:        Mood and Affect: Mood normal.        Behavior: Behavior normal.        Thought Content: Thought content normal.        Judgment: Judgment normal.          Assessment and Plan   1. Essential hypertension   2. Stage 3a chronic kidney disease   3. Chest tightness   4. Dysuria   5. Leg swelling      Plan: 1.,  2.,  4.,  5.  He will continue on his chronic medications.  I will recollect metabolic panel today to monitor renal function and electrolytes since he was started on furosemide and potassium at his last office visit.  He will also follow-up with his  nephrologist as scheduled.  3.   Seems to be well-controlled at this time.  I have refilled nitroglycerin that he takes as needed.  He will continue on his amlodipine as prescribed.   Tests ordered Orders Placed This Encounter  Procedures  . CMP with eGFR([facility])      Meds ordered this encounter  Medications  . nitroGLYCERIN (NITROSTAT) 0.4 MG SL tablet    Sig: Place 1 tablet (0.4 mg total) under the tongue every 5 (five) minutes x 3 doses as needed for chest pain (if no relief after 3 rd dose, proceed to the ED for an evaluation or call 911).    Dispense:  25 tablet    Refill:  3    Order Specific Question:   Supervising Provider    Answer:   [name] [[id number]]    Patient to follow-up in 3 months or sooner as needed.  Ailene Ards, NP

## 2020-05-05 ENCOUNTER — Ambulatory Visit (HOSPITAL_COMMUNITY)
Admission: RE | Admit: 2020-05-05 | Discharge: 2020-05-05 | Disposition: A | Discharge status: Home / Self Care | Payer: Medicare Other | Source: Ambulatory Visit | Attending: Nephrology | Admitting: Nephrology

## 2020-05-05 ENCOUNTER — Other Ambulatory Visit: Payer: Self-pay

## 2020-05-05 DIAGNOSIS — N281 Cyst of kidney, acquired: Secondary | ICD-10-CM | POA: Diagnosis not present

## 2020-05-05 DIAGNOSIS — K8080 Other cholelithiasis without obstruction: Secondary | ICD-10-CM | POA: Diagnosis not present

## 2020-05-05 DIAGNOSIS — N189 Chronic kidney disease, unspecified: Secondary | ICD-10-CM | POA: Diagnosis not present

## 2020-05-05 DIAGNOSIS — N1831 Chronic kidney disease, stage 3a: Secondary | ICD-10-CM | POA: Diagnosis not present

## 2020-05-05 DIAGNOSIS — I10 Essential (primary) hypertension: Secondary | ICD-10-CM | POA: Diagnosis not present

## 2020-05-05 DIAGNOSIS — N3289 Other specified disorders of bladder: Secondary | ICD-10-CM | POA: Diagnosis not present

## 2020-05-15 DIAGNOSIS — H524 Presbyopia: Secondary | ICD-10-CM | POA: Diagnosis not present

## 2020-05-15 DIAGNOSIS — Z961 Presence of intraocular lens: Secondary | ICD-10-CM | POA: Diagnosis not present

## 2020-05-15 DIAGNOSIS — H43813 Vitreous degeneration, bilateral: Secondary | ICD-10-CM | POA: Diagnosis not present

## 2020-05-19 DIAGNOSIS — I129 Hypertensive chronic kidney disease with stage 1 through stage 4 chronic kidney disease, or unspecified chronic kidney disease: Secondary | ICD-10-CM | POA: Diagnosis not present

## 2020-05-19 DIAGNOSIS — E211 Secondary hyperparathyroidism, not elsewhere classified: Secondary | ICD-10-CM | POA: Diagnosis not present

## 2020-05-19 DIAGNOSIS — I5032 Chronic diastolic (congestive) heart failure: Secondary | ICD-10-CM | POA: Diagnosis not present

## 2020-05-19 DIAGNOSIS — N281 Cyst of kidney, acquired: Secondary | ICD-10-CM | POA: Diagnosis not present

## 2020-05-19 DIAGNOSIS — N1831 Chronic kidney disease, stage 3a: Secondary | ICD-10-CM | POA: Diagnosis not present

## 2020-05-19 DIAGNOSIS — Z79899 Other long term (current) drug therapy: Secondary | ICD-10-CM | POA: Diagnosis not present

## 2020-05-26 DIAGNOSIS — N281 Cyst of kidney, acquired: Secondary | ICD-10-CM | POA: Diagnosis not present

## 2020-05-26 DIAGNOSIS — N1831 Chronic kidney disease, stage 3a: Secondary | ICD-10-CM | POA: Diagnosis not present

## 2020-05-26 DIAGNOSIS — I5032 Chronic diastolic (congestive) heart failure: Secondary | ICD-10-CM | POA: Diagnosis not present

## 2020-05-26 DIAGNOSIS — I129 Hypertensive chronic kidney disease with stage 1 through stage 4 chronic kidney disease, or unspecified chronic kidney disease: Secondary | ICD-10-CM | POA: Diagnosis not present

## 2020-05-26 DIAGNOSIS — K8020 Calculus of gallbladder without cholecystitis without obstruction: Secondary | ICD-10-CM | POA: Diagnosis not present

## 2020-05-26 DIAGNOSIS — E211 Secondary hyperparathyroidism, not elsewhere classified: Secondary | ICD-10-CM | POA: Diagnosis not present

## 2020-06-01 ENCOUNTER — Telehealth (INDEPENDENT_AMBULATORY_CARE_PROVIDER_SITE_OTHER): Payer: Medicare Other | Admitting: Internal Medicine

## 2020-06-01 ENCOUNTER — Encounter (INDEPENDENT_AMBULATORY_CARE_PROVIDER_SITE_OTHER): Payer: Self-pay | Admitting: Internal Medicine

## 2020-06-01 VITALS — BP 165/96 | HR 66 | Temp 97.9°F

## 2020-06-01 DIAGNOSIS — U071 COVID-19: Secondary | ICD-10-CM | POA: Diagnosis not present

## 2020-06-01 NOTE — Progress Notes (Addendum)
Metrics: Intervention Frequency ACO  Documented Smoking Status Yearly  Screened one or more times in 24 months  Cessation Counseling or  Active cessation medication Past 24 months  Past 24 months   Guideline developer: UpToDate (See UpToDate for funding source) Date Released: 2014       Wellness Office Visit  Subjective:  Patient ID: Jonathan Gibson, male    DOB: 1951/04/30  Age: 69 y.o. MRN: [id number]  CC: This is an audio telemedicine visit with the permission of the patient who is at home and I am in my office.  I was able to identify with 2 identifiers. Covid exposure HPI  This patient started to get symptoms of COVID-19 disease 4 days ago with body aches, diarrhea, nausea, headache.  He did not have any high fevers.  He does not have shortness of breath.  He was tested yesterday and was positive for COVID-19.  Both his wife and his son who live with him also are positive for COVID-19.  This patient has not been vaccinated. Past Medical History:  Diagnosis Date  . Allergy   . Anxiety   . Arthritis   . Cancer ([place])    skin  . Cataract   . GERD (gastroesophageal reflux disease)   . Heart murmur   . Hyperlipidemia   . Hypertension   . Nephrolithiasis   . Peripheral neuropathy    Past Surgical History:  Procedure Laterality Date  . CERVICAL DISCECTOMY    . CERVICAL FUSION  2008; 2017  . COLONOSCOPY N/A 06/05/2017   Procedure: COLONOSCOPY;  Surgeon: Rogene Houston, MD;  [facility];  Service: Endoscopy;  Laterality: N/A;  1030  . dermatofibrosarcoma    . LEFT HEART CATH AND CORONARY ANGIOGRAPHY N/A 07/20/2019   Procedure: LEFT HEART CATH AND CORONARY ANGIOGRAPHY;  Surgeon: Jettie Booze, MD;  Location: [place] [facility];  Service: Cardiovascular;  Laterality: N/A;  . SPINE SURGERY     neck and low back     Family History  Problem Relation Age of Onset  . Hypertension Father   . Heart disease Father   . Arthritis Father   . Diabetes Father     . Kidney disease Father   . Arthritis Mother   . Heart failure Mother   . Learning disabilities Son   . Mental retardation Son   . Heart attack Paternal Grandmother   . Allergic rhinitis Neg Hx   . Angioedema Neg Hx   . Asthma Neg Hx   . Eczema Neg Hx   . Immunodeficiency Neg Hx   . Urticaria Neg Hx     Social History   Social History Narrative   Lives with wife Olin Hauser and son [name] is mentally handicapped age 18   Walk for exercise   Shoots pistol and reloads own Neurosurgeon      Social History   Tobacco Use  . Smoking status: Former Smoker    Packs/day: 1.00    Types: Cigarettes    Start date: 10/22/1963    Quit date: 08/24/1994    Years since quitting: 25.7  . Smokeless tobacco: Never Used  Substance Use Topics  . Alcohol use: No    Alcohol/week: 0.0 standard drinks    Current Meds  Medication Sig  . amLODipine (NORVASC) 5 MG tablet Take 1 tablet (5 mg total) by mouth daily.  Marland Kitchen aspirin 81 MG chewable tablet Chew 81 mg by mouth daily.  . ferrous sulfate 325 (  65 FE) MG tablet Take 325 mg by mouth 3 (three) times daily with meals.  . furosemide (LASIX) 20 MG tablet Take 1 tablet (20 mg total) by mouth daily as needed for fluid or edema.  . nitroGLYCERIN (NITROSTAT) 0.4 MG SL tablet Place 1 tablet (0.4 mg total) under the tongue every 5 (five) minutes x 3 doses as needed for chest pain (if no relief after 3 rd dose, proceed to the ED for an evaluation or call 911).  . potassium chloride (KLOR-CON) 10 MEQ tablet Take 1 tablet by mouth when you take furosemide       Depression screen [facility] 2/9 03/15/2020 01/18/2020 12/21/2019 11/22/2019 10/29/2019  Decreased Interest 0 0 0 0 0  Down, Depressed, Hopeless 0 0 0 0 0  PHQ - 2 Score 0 0 0 0 0     Objective:   Today's Vitals: BP (!) 165/96 (BP Location: Left Arm, Patient Position: Sitting, Cuff Size: Normal)   Pulse 66   Temp 97.9 F (36.6 C) (Temporal)  Vitals with BMI 06/01/2020 04/06/2020 03/15/2020  Height (No Data)  5\' 8"  5\' 8"   Weight (No Data) 197 lbs 196 lbs 10 oz  BMI - 53.66 44.0  Systolic 347 425 956  Diastolic 96 70 89  Pulse 66 53 55     Physical Exam   His speech is normal on the phone and appears to be alert and orientated.  He has taken his temperature at home and is afebrile.    Assessment   1. COVID-19       Tests ordered No orders of the defined types were placed in this encounter.    Plan: 1. I recommended to this patient to make sure he is well-hydrated and use of vitamin D3 and vitamin C.  If his symptoms deteriorate especially with dyspnea and high fevers, he must go to the emergency room.  I have also recommended that about a month from now he should get COVID-19 vaccination. 2. He will call us back if he has further problems. 3. This phone call lasted 5 minutes and 42 seconds.   No orders of the defined types were placed in this encounter.   [name], MD

## 2020-06-01 NOTE — Progress Notes (Signed)
Pt was called and asked to ID himself name & DOB. Next ask if he would explain natural of call to start check-in for the virtual visit. Pt tested positive for the COVID 05/31/20. Wife & son also are positive . Only taken regular daily medication. NO OTC taken.

## 2020-06-01 NOTE — Addendum Note (Signed)
Addended by: [name] on: 06/01/2020 10:57 AM   Modules accepted: Level of Service

## 2020-06-06 ENCOUNTER — Telehealth (INDEPENDENT_AMBULATORY_CARE_PROVIDER_SITE_OTHER): Payer: Medicare Other | Admitting: Internal Medicine

## 2020-06-07 ENCOUNTER — Other Ambulatory Visit: Payer: Self-pay

## 2020-06-07 ENCOUNTER — Encounter (HOSPITAL_COMMUNITY): Payer: Self-pay | Admitting: Emergency Medicine

## 2020-06-07 ENCOUNTER — Emergency Department (HOSPITAL_COMMUNITY)
Admission: EM | Admit: 2020-06-07 | Discharge: 2020-06-07 | Disposition: A | Discharge status: Home / Self Care | Payer: Medicare Other | Attending: Emergency Medicine | Admitting: Emergency Medicine

## 2020-06-07 ENCOUNTER — Other Ambulatory Visit: Payer: Self-pay | Admitting: Oncology

## 2020-06-07 ENCOUNTER — Ambulatory Visit (HOSPITAL_COMMUNITY)
Admission: RE | Admit: 2020-06-07 | Discharge: 2020-06-07 | Disposition: A | Discharge status: Home / Self Care | Payer: Medicare Other | Source: Ambulatory Visit | Attending: Pulmonary Disease | Admitting: Pulmonary Disease

## 2020-06-07 ENCOUNTER — Emergency Department (HOSPITAL_COMMUNITY): Payer: Medicare Other

## 2020-06-07 DIAGNOSIS — G479 Sleep disorder, unspecified: Secondary | ICD-10-CM | POA: Diagnosis not present

## 2020-06-07 DIAGNOSIS — Z7982 Long term (current) use of aspirin: Secondary | ICD-10-CM | POA: Diagnosis not present

## 2020-06-07 DIAGNOSIS — Z23 Encounter for immunization: Secondary | ICD-10-CM | POA: Insufficient documentation

## 2020-06-07 DIAGNOSIS — I129 Hypertensive chronic kidney disease with stage 1 through stage 4 chronic kidney disease, or unspecified chronic kidney disease: Secondary | ICD-10-CM | POA: Insufficient documentation

## 2020-06-07 DIAGNOSIS — Z87891 Personal history of nicotine dependence: Secondary | ICD-10-CM | POA: Diagnosis not present

## 2020-06-07 DIAGNOSIS — N189 Chronic kidney disease, unspecified: Secondary | ICD-10-CM | POA: Diagnosis not present

## 2020-06-07 DIAGNOSIS — J189 Pneumonia, unspecified organism: Secondary | ICD-10-CM | POA: Diagnosis not present

## 2020-06-07 DIAGNOSIS — R112 Nausea with vomiting, unspecified: Secondary | ICD-10-CM | POA: Diagnosis not present

## 2020-06-07 DIAGNOSIS — R1110 Vomiting, unspecified: Secondary | ICD-10-CM | POA: Diagnosis not present

## 2020-06-07 DIAGNOSIS — Z85828 Personal history of other malignant neoplasm of skin: Secondary | ICD-10-CM | POA: Diagnosis not present

## 2020-06-07 DIAGNOSIS — J1282 Pneumonia due to coronavirus disease 2019: Secondary | ICD-10-CM | POA: Insufficient documentation

## 2020-06-07 DIAGNOSIS — I10 Essential (primary) hypertension: Secondary | ICD-10-CM | POA: Diagnosis not present

## 2020-06-07 DIAGNOSIS — Z79899 Other long term (current) drug therapy: Secondary | ICD-10-CM | POA: Diagnosis not present

## 2020-06-07 DIAGNOSIS — U071 COVID-19: Secondary | ICD-10-CM

## 2020-06-07 DIAGNOSIS — R110 Nausea: Secondary | ICD-10-CM | POA: Diagnosis not present

## 2020-06-07 LAB — COMPREHENSIVE METABOLIC PANEL
ALT: 25 U/L (ref 0–44)
AST: 26 U/L (ref 15–41)
Albumin: 3.5 g/dL (ref 3.5–5.0)
Alkaline Phosphatase: 67 U/L (ref 38–126)
Anion gap: 11 (ref 5–15)
BUN: 16 mg/dL (ref 8–23)
CO2: 20 mmol/L — ABNORMAL LOW (ref 22–32)
Calcium: 8.5 mg/dL — ABNORMAL LOW (ref 8.9–10.3)
Chloride: 103 mmol/L (ref 98–111)
Creatinine, Ser: 1.26 mg/dL — ABNORMAL HIGH (ref 0.61–1.24)
GFR calc Af Amer: >60 mL/min (ref >60)
GFR calc non Af Amer: 57 mL/min — ABNORMAL LOW (ref >60)
Glucose, Bld: 168 mg/dL — ABNORMAL HIGH (ref 70–99)
Potassium: 3.9 mmol/L (ref 3.5–5.1)
Sodium: 134 mmol/L — ABNORMAL LOW (ref 135–145)
Total Bilirubin: 0.8 mg/dL (ref 0.3–1.2)
Total Protein: 6.9 g/dL (ref 6.5–8.1)

## 2020-06-07 LAB — CBC WITH DIFFERENTIAL/PLATELET
Abs Immature Granulocytes: 0.02 10*3/uL (ref 0.00–0.07)
Basophils Absolute: 0 10*3/uL (ref 0.0–0.1)
Basophils Relative: 0 %
Eosinophils Absolute: 0 10*3/uL (ref 0.0–0.5)
Eosinophils Relative: 0 %
HCT: 44.4 % (ref 39.0–52.0)
Hemoglobin: 15.3 g/dL (ref 13.0–17.0)
Immature Granulocytes: 0 %
Lymphocytes Relative: 10 %
Lymphs Abs: 0.6 10*3/uL — ABNORMAL LOW (ref 0.7–4.0)
MCH: 29.6 pg (ref 26.0–34.0)
MCHC: 34.5 g/dL (ref 30.0–36.0)
MCV: 85.9 fL (ref 80.0–100.0)
Monocytes Absolute: 0.5 10*3/uL (ref 0.1–1.0)
Monocytes Relative: 7 %
Neutro Abs: 5.1 10*3/uL (ref 1.7–7.7)
Neutrophils Relative %: 83 %
Platelets: 129 10*3/uL — ABNORMAL LOW (ref 150–400)
RBC: 5.17 MIL/uL (ref 4.22–5.81)
RDW: 13.3 % (ref 11.5–15.5)
WBC: 6.2 10*3/uL (ref 4.0–10.5)
nRBC: 0 % (ref 0.0–0.2)

## 2020-06-07 LAB — LIPASE, BLOOD: Lipase: 49 U/L (ref 11–51)

## 2020-06-07 LAB — SARS CORONAVIRUS 2 BY RT PCR (HOSPITAL ORDER, PERFORMED IN ~~LOC~~ HOSPITAL LAB): SARS Coronavirus 2: POSITIVE — AB

## 2020-06-07 MED ORDER — SODIUM CHLORIDE 0.9 % IV SOLN: INTRAVENOUS | Status: DC | PRN

## 2020-06-07 MED ORDER — SODIUM CHLORIDE 0.9 % IV BOLUS
500.0000 mL | Freq: Once | INTRAVENOUS | Status: AC
  Administered 2020-06-07: 500 mL via INTRAVENOUS

## 2020-06-07 MED ORDER — ONDANSETRON HCL 4 MG/2ML IJ SOLN
4.0000 mg | Freq: Once | INTRAMUSCULAR | Status: AC
  Administered 2020-06-07: 4 mg via INTRAVENOUS
  Filled 2020-06-07: qty 2

## 2020-06-07 MED ORDER — FAMOTIDINE IN NACL 20-0.9 MG/50ML-% IV SOLN: 20.0000 mg | Freq: Once | INTRAVENOUS | Status: DC | PRN

## 2020-06-07 MED ORDER — ALPRAZOLAM 0.25 MG PO TABS
0.2500 mg | ORAL_TABLET | Freq: Every evening | ORAL | 0 refills | Status: DC | PRN
Start: 2020-06-07 — End: 2020-07-12

## 2020-06-07 MED ORDER — EPINEPHRINE 0.3 MG/0.3ML IJ SOAJ: 0.3000 mg | Freq: Once | INTRAMUSCULAR | Status: DC | PRN

## 2020-06-07 MED ORDER — ALBUTEROL SULFATE HFA 108 (90 BASE) MCG/ACT IN AERS: 2.0000 | INHALATION_SPRAY | Freq: Once | RESPIRATORY_TRACT | Status: DC | PRN

## 2020-06-07 MED ORDER — METHYLPREDNISOLONE SODIUM SUCC 125 MG IJ SOLR: 125.0000 mg | Freq: Once | INTRAMUSCULAR | Status: DC | PRN

## 2020-06-07 MED ORDER — DIPHENHYDRAMINE HCL 50 MG/ML IJ SOLN: 50.0000 mg | Freq: Once | INTRAMUSCULAR | Status: DC | PRN

## 2020-06-07 MED ORDER — ONDANSETRON HCL 4 MG PO TABS
4.0000 mg | ORAL_TABLET | Freq: Four times a day (QID) | ORAL | 0 refills | Status: DC
Start: 2020-06-07 — End: 2020-07-12

## 2020-06-07 MED ORDER — SODIUM CHLORIDE 0.9 % IV SOLN
1200.0000 mg | Freq: Once | INTRAVENOUS | Status: AC
  Administered 2020-06-07: 1200 mg via INTRAVENOUS
  Filled 2020-06-07: qty 10

## 2020-06-07 NOTE — ED Provider Notes (Signed)
[facility] Provider Note   CSN: [id number] Arrival date & time: 06/07/20  0156     History Chief Complaint  Patient presents with  . COVID    Jonathan Gibson is a 69 y.o. male.  Patient presents to the emergency department for evaluation of nausea, vomiting and difficulty sleeping.  Patient reports that he was diagnosed with Covid a week ago.  Since then he has been experiencing the nausea and vomiting with diarrhea.  He has not been experiencing abdominal pain.  Patient denies chest pain, cough, shortness of breath.        Past Medical History:  Diagnosis Date  . Allergy   . Anxiety   . Arthritis   . Cancer ([place])    skin  . Cataract   . GERD (gastroesophageal reflux disease)   . Heart murmur   . Hyperlipidemia   . Hypertension   . Nephrolithiasis   . Peripheral neuropathy     Patient Active Problem List   Diagnosis Date Noted  . Murmur 01/18/2020  . Chronic kidney disease 11/22/2019  . Chest pain 11/22/2019  . Angina pectoris ([place])   . Allergy to alpha-gal 08/19/2017  . Coronary atherosclerosis 06/04/2010  . Hyperlipidemia 06/01/2010  . Essential hypertension 06/01/2010  . Esophageal reflux 06/01/2010  . Degenerative joint disease (DJD) of lumbar spine 06/01/2010  . INSOMNIA UNSPECIFIED 06/01/2010    Past Surgical History:  Procedure Laterality Date  . CERVICAL DISCECTOMY    . CERVICAL FUSION  2008; 2017  . COLONOSCOPY N/A 06/05/2017   Procedure: COLONOSCOPY;  Surgeon: Rogene Houston, MD;  [facility];  Service: Endoscopy;  Laterality: N/A;  1030  . dermatofibrosarcoma    . LEFT HEART CATH AND CORONARY ANGIOGRAPHY N/A 07/20/2019   Procedure: LEFT HEART CATH AND CORONARY ANGIOGRAPHY;  Surgeon: Jettie Booze, MD;  Location: [facility];  Service: Cardiovascular;  Laterality: N/A;  . SPINE SURGERY     neck and low back       Family History  Problem Relation Age of Onset  . Hypertension Father   .  Heart disease Father   . Arthritis Father   . Diabetes Father   . Kidney disease Father   . Arthritis Mother   . Heart failure Mother   . Learning disabilities Son   . Mental retardation Son   . Heart attack Paternal Grandmother   . Allergic rhinitis Neg Hx   . Angioedema Neg Hx   . Asthma Neg Hx   . Eczema Neg Hx   . Immunodeficiency Neg Hx   . Urticaria Neg Hx     Social History   Tobacco Use  . Smoking status: Former Smoker    Packs/day: 1.00    Types: Cigarettes    Start date: 10/22/1963    Quit date: 08/24/1994    Years since quitting: 25.8  . Smokeless tobacco: Never Used  Vaping Use  . Vaping Use: Never used  Substance Use Topics  . Alcohol use: No    Alcohol/week: 0.0 standard drinks  . Drug use: No    Home Medications Prior to Admission medications   Medication Sig Start Date End Date Taking? Authorizing Provider  ALPRAZolam (XANAX) 0.25 MG tablet Take 1 tablet (0.25 mg total) by mouth at bedtime as needed for sleep. 06/07/20   Orpah Greek, MD  amLODipine (NORVASC) 5 MG tablet Take 1 tablet (5 mg total) by mouth daily. 03/15/20   Ailene Ards,  NP  aspirin 81 MG chewable tablet Chew 81 mg by mouth daily.    [provider]  ferrous sulfate 325 (65 FE) MG tablet Take 325 mg by mouth 3 (three) times daily with meals.    [provider]  furosemide (LASIX) 20 MG tablet Take 1 tablet (20 mg total) by mouth daily as needed for fluid or edema. 03/15/20   Ailene Ards, NP  gabapentin (NEURONTIN) 400 MG capsule Take 1 capsule by mouth at bedtime Patient taking differently: Take 400 mg by mouth at bedtime.  03/06/20 04/06/20  [name], MD  nitroGLYCERIN (NITROSTAT) 0.4 MG SL tablet Place 1 tablet (0.4 mg total) under the tongue every 5 (five) minutes x 3 doses as needed for chest pain (if no relief after 3 rd dose, proceed to the ED for an evaluation or call 911). 04/06/20 07/05/20  Ailene Ards, NP  ondansetron (ZOFRAN) 4 MG tablet Take 1  tablet (4 mg total) by mouth every 6 (six) hours. 06/07/20   Orpah Greek, MD  potassium chloride (KLOR-CON) 10 MEQ tablet Take 1 tablet by mouth when you take furosemide 03/15/20   Ailene Ards, NP  traZODone (DESYREL) 150 MG tablet TAKE 1 TABLET BY MOUTH ONCE DAILY FOR SLEEP 03/06/20 04/06/20  [name], MD  gabapentin (NEURONTIN) 400 MG capsule Take 1 capsule by mouth at bedtime 01/03/20   Ailene Ards, NP  traZODone (DESYREL) 150 MG tablet TAKE 1 TABLET BY MOUTH ONCE DAILY FOR SLEEP 01/03/20   Ailene Ards, NP    Allergies    Alpha-gal, Meloxicam, Flu virus vaccine, Ace inhibitors, Gemfibrozil, and Statins  Review of Systems   Review of Systems  Gastrointestinal: Positive for diarrhea, nausea and vomiting.  Psychiatric/Behavioral: Positive for sleep disturbance.  All other systems reviewed and are negative.   Physical Exam Updated Vital Signs BP 136/76   Pulse 73   Temp 99.1 F (37.3 C) (Oral)   Resp 19   Ht 5\' 8"  (1.727 m)   Wt 84.8 kg   SpO2 96%   BMI 28.43 kg/m   Physical Exam Vitals and nursing note reviewed.  Constitutional:      General: He is not in acute distress.    Appearance: Normal appearance. He is well-developed.  HENT:     Head: Normocephalic and atraumatic.     Right Ear: Hearing normal.     Left Ear: Hearing normal.     Nose: Nose normal.  Eyes:     Conjunctiva/sclera: Conjunctivae normal.     Pupils: Pupils are equal, round, and reactive to light.  Cardiovascular:     Rate and Rhythm: Regular rhythm.     Heart sounds: S1 normal and S2 normal. No murmur heard.  No friction rub. No gallop.   Pulmonary:     Effort: Pulmonary effort is normal. No respiratory distress.     Breath sounds: Normal breath sounds.  Chest:     Chest wall: No tenderness.  Abdominal:     General: Bowel sounds are normal.     Palpations: Abdomen is soft.     Tenderness: There is no abdominal tenderness. There is no guarding or rebound. Negative signs  include Murphy's sign and McBurney's sign.     Hernia: No hernia is present.  Musculoskeletal:        General: Normal range of motion.     Cervical back: Normal range of motion and neck supple.  Skin:    General:  Skin is warm and dry.     Findings: No rash.  Neurological:     Mental Status: He is alert and oriented to person, place, and time.     GCS: GCS eye subscore is 4. GCS verbal subscore is 5. GCS motor subscore is 6.     Cranial Nerves: No cranial nerve deficit.     Sensory: No sensory deficit.     Coordination: Coordination normal.  Psychiatric:        Speech: Speech normal.        Behavior: Behavior normal.        Thought Content: Thought content normal.     ED Results / Procedures / Treatments   Labs (all labs ordered are listed, but only abnormal results are displayed) Labs Reviewed  SARS CORONAVIRUS 2 BY RT PCR ([facility]) - Abnormal; Notable for the following components:      Result Value   SARS Coronavirus 2 POSITIVE (*)    All other components within normal limits  CBC WITH DIFFERENTIAL/PLATELET - Abnormal; Notable for the following components:   Platelets 129 (*)    Lymphs Abs 0.6 (*)    All other components within normal limits  COMPREHENSIVE METABOLIC PANEL - Abnormal; Notable for the following components:   Sodium 134 (*)    CO2 20 (*)    Glucose, Bld 168 (*)    Creatinine, Ser 1.26 (*)    Calcium 8.5 (*)    GFR calc non Af Amer 57 (*)    All other components within normal limits  LIPASE, BLOOD  URINALYSIS, ROUTINE W REFLEX MICROSCOPIC    EKG None  Radiology DG Chest Port 1 View  Result Date: 06/07/2020 CLINICAL DATA:  Cough, nausea and vomiting.  COVID positive. EXAM: PORTABLE CHEST 1 VIEW COMPARISON:  11/14/2006 FINDINGS: Lung volumes are low. Heart size is normal. Normal mediastinal contours. Mild patchy bilateral airspace opacities in both lung bases and periphery of the right mid lung no pleural  fluid or pneumothorax. Surgical hardware in the lower cervical spine partially included. IMPRESSION: Low lung volumes with mild patchy bilateral airspace opacities, pattern typical of COVID-19 pneumonia. Electronically Signed   By: Keith Rake M.D.   On: 06/07/2020 03:47    Procedures Procedures (including critical care time)  Medications Ordered in ED Medications  sodium chloride 0.9 % bolus 500 mL (0 mLs Intravenous Stopped 06/07/20 0509)  ondansetron (ZOFRAN) injection 4 mg (4 mg Intravenous Given 06/07/20 0306)    ED Course  I have reviewed the triage vital signs and the nursing notes.  Pertinent labs & imaging results that were available during my care of the patient were reviewed by me and considered in my medical decision making (see chart for details).    MDM Rules/Calculators/A&P                          Patient presents to the emergency department for evaluation of nausea and vomiting.  In addition to emesis he has been experiencing insomnia.  Patient reports that he had a positive Covid test approximately a week ago at a local drugstore.  He denies cough and shortness of breath.  Vital signs are unremarkable.  No hypoxia.  Chest x-ray does show some findings consistent with Covid pneumonia but he is not requiring oxygen.  He was given IV fluids and Zofran and feels better.  At this point he does not require hospitalization but  due to his age would consider monoclonal antibodies.  Looking at his records it appears today is day 10 of his symptoms.  I have sent information to infusion to see if they can arrange for his infusion of monoclonal antibodies today.  Patient was given return precautions. Final Clinical Impression(s) / ED Diagnoses Final diagnoses:  Non-intractable vomiting with nausea, unspecified vomiting type  Pneumonia due to COVID-19 virus    Rx / DC Orders ED Discharge Orders         Ordered    ALPRAZolam (XANAX) 0.25 MG tablet  At bedtime PRN     Discontinue   Reprint     06/07/20 0438    ondansetron (ZOFRAN) 4 MG tablet  Every 6 hours     Discontinue  Reprint     06/07/20 0438           Orpah Greek, MD 06/07/20 [phone]

## 2020-06-07 NOTE — Discharge Instructions (Signed)

## 2020-06-07 NOTE — ED Triage Notes (Signed)
Pt states that he tested positive for COVID 1 week ago. Here tonight due to N/V/D x 6 days and not being able to sleep for 6 days as well.

## 2020-06-07 NOTE — Discharge Instructions (Signed)
You should be contacted today to schedule a medication infusion as an outpatient to help you with your Covid. Schedule follow-up with Dr. Anastasio Champion as well. Return to the ER if you develop difficulty breathing.

## 2020-06-07 NOTE — Progress Notes (Signed)
  Diagnosis: COVID-19  Physician: Dr Patrick Wright  Procedure: Covid Infusion Clinic Med: casirivimab\imdevimab infusion - Provided patient with casirivimab\imdevimab fact sheet for patients, parents and caregivers prior to infusion.  Complications: No immediate complications noted.  Discharge: Discharged home   Lusine Corlett 06/07/2020   

## 2020-07-08 ENCOUNTER — Other Ambulatory Visit (INDEPENDENT_AMBULATORY_CARE_PROVIDER_SITE_OTHER): Payer: Self-pay | Admitting: Nurse Practitioner

## 2020-07-08 ENCOUNTER — Other Ambulatory Visit (INDEPENDENT_AMBULATORY_CARE_PROVIDER_SITE_OTHER): Payer: Self-pay | Admitting: Internal Medicine

## 2020-07-08 DIAGNOSIS — R0609 Other forms of dyspnea: Secondary | ICD-10-CM

## 2020-07-08 DIAGNOSIS — N1831 Chronic kidney disease, stage 3a: Secondary | ICD-10-CM

## 2020-07-08 DIAGNOSIS — R0600 Dyspnea, unspecified: Secondary | ICD-10-CM

## 2020-07-08 DIAGNOSIS — N3090 Cystitis, unspecified without hematuria: Secondary | ICD-10-CM

## 2020-07-08 DIAGNOSIS — E785 Hyperlipidemia, unspecified: Secondary | ICD-10-CM

## 2020-07-08 DIAGNOSIS — R079 Chest pain, unspecified: Secondary | ICD-10-CM

## 2020-07-08 DIAGNOSIS — M7989 Other specified soft tissue disorders: Secondary | ICD-10-CM

## 2020-07-12 ENCOUNTER — Encounter (INDEPENDENT_AMBULATORY_CARE_PROVIDER_SITE_OTHER): Payer: Self-pay | Admitting: Internal Medicine

## 2020-07-12 ENCOUNTER — Other Ambulatory Visit: Payer: Self-pay

## 2020-07-12 ENCOUNTER — Ambulatory Visit (INDEPENDENT_AMBULATORY_CARE_PROVIDER_SITE_OTHER): Payer: Medicare Other | Admitting: Internal Medicine

## 2020-07-12 VITALS — BP 140/80 | HR 72 | Ht 68.0 in | Wt 193.8 lb

## 2020-07-12 DIAGNOSIS — I10 Essential (primary) hypertension: Secondary | ICD-10-CM

## 2020-07-12 DIAGNOSIS — N1831 Chronic kidney disease, stage 3a: Secondary | ICD-10-CM | POA: Diagnosis not present

## 2020-07-12 DIAGNOSIS — U071 COVID-19: Secondary | ICD-10-CM | POA: Diagnosis not present

## 2020-07-12 NOTE — Progress Notes (Signed)
Metrics: Intervention Frequency ACO  Documented Smoking Status Yearly  Screened one or more times in 24 months  Cessation Counseling or  Active cessation medication Past 24 months  Past 24 months   Guideline developer: UpToDate (See UpToDate for funding source) Date Released: 2014       Wellness Office Visit  Subjective:  Patient ID: Jonathan Gibson, male    DOB: 1951-09-26  Age: 69 y.o. MRN: [id number]  CC: This man comes in for follow-up of hypertension, recent COVID-19 disease and chronic kidney disease. HPI  He was diagnosed with COVID-19 infection and did undergo monoclonal antibody treatment and has done well.  He has been recommended to get vaccinated in around November and he is seriously now thinking about doing so. He continues on amlodipine for his hypertension.  He denies any chest pain, dyspnea, palpitations or limb weakness. Past Medical History:  Diagnosis Date  . Allergy   . Anxiety   . Arthritis   . Cancer ([place])    skin  . Cataract   . GERD (gastroesophageal reflux disease)   . Heart murmur   . Hyperlipidemia   . Hypertension   . Nephrolithiasis   . Peripheral neuropathy    Past Surgical History:  Procedure Laterality Date  . CERVICAL DISCECTOMY    . CERVICAL FUSION  2008; 2017  . COLONOSCOPY N/A 06/05/2017   Procedure: COLONOSCOPY;  Surgeon: Rogene Houston, MD;  [facility];  Service: Endoscopy;  Laterality: N/A;  1030  . dermatofibrosarcoma    . LEFT HEART CATH AND CORONARY ANGIOGRAPHY N/A 07/20/2019   Procedure: LEFT HEART CATH AND CORONARY ANGIOGRAPHY;  Surgeon: Jettie Booze, MD;  Location: [facility];  Service: Cardiovascular;  Laterality: N/A;  . SPINE SURGERY     neck and low back     Family History  Problem Relation Age of Onset  . Hypertension Father   . Heart disease Father   . Arthritis Father   . Diabetes Father   . Kidney disease Father   . Arthritis Mother   . Heart failure Mother   . Learning  disabilities Son   . Mental retardation Son   . Heart attack Paternal Grandmother   . Allergic rhinitis Neg Hx   . Angioedema Neg Hx   . Asthma Neg Hx   . Eczema Neg Hx   . Immunodeficiency Neg Hx   . Urticaria Neg Hx     Social History   Social History Narrative   Lives with wife Jonathan Gibson and son Jonathan Gibson is mentally handicapped age 44   Walk for exercise   Shoots pistol and reloads own Neurosurgeon      Social History   Tobacco Use  . Smoking status: Former Smoker    Packs/day: 1.00    Types: Cigarettes    Start date: 10/22/1963    Quit date: 08/24/1994    Years since quitting: 25.9  . Smokeless tobacco: Never Used  Substance Use Topics  . Alcohol use: No    Alcohol/week: 0.0 standard drinks    Current Meds  Medication Sig  . amLODipine (NORVASC) 5 MG tablet Take 1 tablet (5 mg total) by mouth daily.  Marland Kitchen aspirin 81 MG chewable tablet Chew 81 mg by mouth daily.  . ferrous sulfate 325 (65 FE) MG tablet Take 325 mg by mouth 3 (three) times daily with meals.  . furosemide (LASIX) 20 MG tablet Take 1 tablet (20 mg total) by mouth daily as  needed for fluid or edema.  . gabapentin (NEURONTIN) 400 MG capsule Take 1 capsule (400 mg total) by mouth at bedtime.  . nitroGLYCERIN (NITROSTAT) 0.4 MG SL tablet Place 1 tablet (0.4 mg total) under the tongue every 5 (five) minutes x 3 doses as needed for chest pain (if no relief after 3 rd dose, proceed to the ED for an evaluation or call 911).  . potassium chloride (KLOR-CON) 10 MEQ tablet TAKE ONE TABLET BY MOUTH WHEN YOU TAKE FUROSEMIDE  . traZODone (DESYREL) 150 MG tablet TAKE 1 TABLET BY MOUTH ONCE DAILY FOR SLEEP      Depression screen [facility] 2/9 03/15/2020 01/18/2020 12/21/2019 11/22/2019 10/29/2019  Decreased Interest 0 0 0 0 0  Down, Depressed, Hopeless 0 0 0 0 0  PHQ - 2 Score 0 0 0 0 0     Objective:   Today's Vitals: BP 140/80   Pulse 72   Ht 5\' 8"  (1.727 m)   Wt 193 lb 12.8 oz (87.9 kg)   BMI 29.47 kg/m  Vitals with BMI  07/12/2020 06/07/2020 06/07/2020  Height 5\' 8"  - -  Weight 193 lbs 13 oz - -  BMI 05.39 - -  Systolic [phone]  Diastolic 80 79 74  Pulse 72 75 84     Physical Exam  He looks systemically well.  Blood pressure is good for his age.  Weight is stable.  Alert and orientated without any focal neurological signs.     Assessment   1. Stage 3a chronic kidney disease   2. Essential hypertension   3. COVID-19       Tests ordered No orders of the defined types were placed in this encounter.    Plan: 1. He will continue with the same dose of amlodipine for his hypertension which is keeping his blood pressure under reasonable control. 2. He continues to keep hydrated for his chronic kidney disease and his last creatinine level was actually in the normal range. 3. He is now approximately 1 month post COVID-19 infection and I have highly recommended that he does get vaccinated in about 2 months time.  I think he does understand the importance of this now. 4. Follow-up in about 4 months with Judson Roch.   No orders of the defined types were placed in this encounter.   [name], MD

## 2020-07-24 DIAGNOSIS — I5032 Chronic diastolic (congestive) heart failure: Secondary | ICD-10-CM | POA: Diagnosis not present

## 2020-07-24 DIAGNOSIS — E211 Secondary hyperparathyroidism, not elsewhere classified: Secondary | ICD-10-CM | POA: Diagnosis not present

## 2020-07-24 DIAGNOSIS — N1831 Chronic kidney disease, stage 3a: Secondary | ICD-10-CM | POA: Diagnosis not present

## 2020-07-24 DIAGNOSIS — N281 Cyst of kidney, acquired: Secondary | ICD-10-CM | POA: Diagnosis not present

## 2020-07-24 DIAGNOSIS — I129 Hypertensive chronic kidney disease with stage 1 through stage 4 chronic kidney disease, or unspecified chronic kidney disease: Secondary | ICD-10-CM | POA: Diagnosis not present

## 2020-07-24 DIAGNOSIS — K8020 Calculus of gallbladder without cholecystitis without obstruction: Secondary | ICD-10-CM | POA: Diagnosis not present

## 2020-07-26 ENCOUNTER — Other Ambulatory Visit: Payer: Self-pay | Admitting: Nephrology

## 2020-07-26 DIAGNOSIS — N2581 Secondary hyperparathyroidism of renal origin: Secondary | ICD-10-CM

## 2020-07-26 DIAGNOSIS — I129 Hypertensive chronic kidney disease with stage 1 through stage 4 chronic kidney disease, or unspecified chronic kidney disease: Secondary | ICD-10-CM

## 2020-07-26 DIAGNOSIS — N1831 Chronic kidney disease, stage 3a: Secondary | ICD-10-CM

## 2020-07-26 DIAGNOSIS — I5032 Chronic diastolic (congestive) heart failure: Secondary | ICD-10-CM

## 2020-07-26 DIAGNOSIS — N281 Cyst of kidney, acquired: Secondary | ICD-10-CM

## 2020-07-27 ENCOUNTER — Ambulatory Visit (HOSPITAL_COMMUNITY)
Admission: RE | Admit: 2020-07-27 | Discharge: 2020-07-27 | Disposition: A | Discharge status: Home / Self Care | Payer: Medicare Other | Source: Ambulatory Visit | Attending: Nephrology | Admitting: Nephrology

## 2020-07-27 ENCOUNTER — Other Ambulatory Visit: Payer: Self-pay

## 2020-07-27 DIAGNOSIS — I129 Hypertensive chronic kidney disease with stage 1 through stage 4 chronic kidney disease, or unspecified chronic kidney disease: Secondary | ICD-10-CM | POA: Insufficient documentation

## 2020-07-27 DIAGNOSIS — N1831 Chronic kidney disease, stage 3a: Secondary | ICD-10-CM

## 2020-07-27 DIAGNOSIS — N2581 Secondary hyperparathyroidism of renal origin: Secondary | ICD-10-CM | POA: Diagnosis not present

## 2020-07-27 DIAGNOSIS — N281 Cyst of kidney, acquired: Secondary | ICD-10-CM

## 2020-07-27 DIAGNOSIS — I5032 Chronic diastolic (congestive) heart failure: Secondary | ICD-10-CM | POA: Diagnosis not present

## 2020-07-28 ENCOUNTER — Other Ambulatory Visit (HOSPITAL_COMMUNITY): Payer: Self-pay | Admitting: Nephrology

## 2020-07-28 ENCOUNTER — Other Ambulatory Visit: Payer: Self-pay | Admitting: Nephrology

## 2020-07-28 DIAGNOSIS — N281 Cyst of kidney, acquired: Secondary | ICD-10-CM | POA: Diagnosis not present

## 2020-07-28 DIAGNOSIS — I5032 Chronic diastolic (congestive) heart failure: Secondary | ICD-10-CM | POA: Diagnosis not present

## 2020-07-28 DIAGNOSIS — N1831 Chronic kidney disease, stage 3a: Secondary | ICD-10-CM

## 2020-07-28 DIAGNOSIS — U071 COVID-19: Secondary | ICD-10-CM | POA: Diagnosis not present

## 2020-07-28 DIAGNOSIS — I129 Hypertensive chronic kidney disease with stage 1 through stage 4 chronic kidney disease, or unspecified chronic kidney disease: Secondary | ICD-10-CM

## 2020-07-28 DIAGNOSIS — E211 Secondary hyperparathyroidism, not elsewhere classified: Secondary | ICD-10-CM

## 2020-08-04 ENCOUNTER — Other Ambulatory Visit (INDEPENDENT_AMBULATORY_CARE_PROVIDER_SITE_OTHER): Payer: Self-pay | Admitting: Nurse Practitioner

## 2020-08-04 DIAGNOSIS — I10 Essential (primary) hypertension: Secondary | ICD-10-CM

## 2020-08-08 ENCOUNTER — Other Ambulatory Visit (INDEPENDENT_AMBULATORY_CARE_PROVIDER_SITE_OTHER): Payer: Self-pay | Admitting: Nurse Practitioner

## 2020-09-25 DIAGNOSIS — I129 Hypertensive chronic kidney disease with stage 1 through stage 4 chronic kidney disease, or unspecified chronic kidney disease: Secondary | ICD-10-CM | POA: Diagnosis not present

## 2020-09-25 DIAGNOSIS — U071 COVID-19: Secondary | ICD-10-CM | POA: Diagnosis not present

## 2020-09-25 DIAGNOSIS — I5032 Chronic diastolic (congestive) heart failure: Secondary | ICD-10-CM | POA: Diagnosis not present

## 2020-09-25 DIAGNOSIS — N281 Cyst of kidney, acquired: Secondary | ICD-10-CM | POA: Diagnosis not present

## 2020-09-25 DIAGNOSIS — N1831 Chronic kidney disease, stage 3a: Secondary | ICD-10-CM | POA: Diagnosis not present

## 2020-09-29 DIAGNOSIS — I129 Hypertensive chronic kidney disease with stage 1 through stage 4 chronic kidney disease, or unspecified chronic kidney disease: Secondary | ICD-10-CM | POA: Diagnosis not present

## 2020-09-29 DIAGNOSIS — I5032 Chronic diastolic (congestive) heart failure: Secondary | ICD-10-CM | POA: Diagnosis not present

## 2020-09-29 DIAGNOSIS — N1831 Chronic kidney disease, stage 3a: Secondary | ICD-10-CM | POA: Diagnosis not present

## 2020-09-29 DIAGNOSIS — E611 Iron deficiency: Secondary | ICD-10-CM | POA: Diagnosis not present

## 2020-09-29 DIAGNOSIS — E211 Secondary hyperparathyroidism, not elsewhere classified: Secondary | ICD-10-CM | POA: Diagnosis not present

## 2020-11-06 ENCOUNTER — Telehealth (INDEPENDENT_AMBULATORY_CARE_PROVIDER_SITE_OTHER): Payer: Self-pay | Admitting: Nurse Practitioner

## 2020-11-06 ENCOUNTER — Encounter (INDEPENDENT_AMBULATORY_CARE_PROVIDER_SITE_OTHER): Payer: Self-pay | Admitting: Nurse Practitioner

## 2020-11-06 ENCOUNTER — Other Ambulatory Visit: Payer: Self-pay

## 2020-11-06 ENCOUNTER — Telehealth (INDEPENDENT_AMBULATORY_CARE_PROVIDER_SITE_OTHER): Payer: Medicare Other | Admitting: Nurse Practitioner

## 2020-11-06 VITALS — BP 137/83 | HR 57 | Ht 68.0 in | Wt 184.0 lb

## 2020-11-06 DIAGNOSIS — Z0000 Encounter for general adult medical examination without abnormal findings: Secondary | ICD-10-CM

## 2020-11-06 DIAGNOSIS — N1831 Chronic kidney disease, stage 3a: Secondary | ICD-10-CM

## 2020-11-06 DIAGNOSIS — R0789 Other chest pain: Secondary | ICD-10-CM | POA: Diagnosis not present

## 2020-11-06 MED ORDER — NITROGLYCERIN 0.4 MG SL SUBL: 0.4000 mg | SUBLINGUAL_TABLET | SUBLINGUAL | 3 refills | Status: AC | PRN

## 2020-11-06 MED ORDER — FERROUS SULFATE 325 (65 FE) MG PO TABS: 325.0000 mg | ORAL_TABLET | Freq: Three times a day (TID) | ORAL | 2 refills | Status: AC

## 2020-11-06 NOTE — Telephone Encounter (Signed)
Please print and mail patient's after visit summary from office visit dated on 11/06/2020.  Thank you.

## 2020-11-06 NOTE — Patient Instructions (Signed)
  Jonathan Gibson , Thank you for taking time to come for your Medicare Wellness Visit. I appreciate your ongoing commitment to your health goals. Please review the following plan we discussed and let me know if I can assist you in the future.   These are the goals we discussed: Goals    . Blood Pressure < 140/90       This is a list of the screening recommended for you and due dates:  Health Maintenance  Topic Date Due  . COVID-19 Vaccine (1) Never done  . Colon Cancer Screening  06/06/2027  .  Hepatitis C: One time screening is recommended by Center for Disease Control  (CDC) for  adults born from [birth year] through 1967.   Completed  . Flu Shot  Discontinued  . Tetanus Vaccine  Discontinued  . Pneumonia vaccines  Discontinued

## 2020-11-06 NOTE — Progress Notes (Signed)
An audio-only tele-health visit was conducted today. I connected with  Jonathan Gibson on 11/06/20 utilizing audio-only technology and verified that I am speaking with the correct person using two identifiers. The patient was located at their home, and I was located at home during the encounter. I discussed the limitations of evaluation and management by telemedicine. The patient expressed understanding and agreed to proceed.    Subjective:   Jonathan Gibson is a 70 y.o. male who presents for Medicare Annual/Subsequent preventive examination.  Review of Systems    Cardiac Risk Factors include: advanced age (>6men, >57 women);dyslipidemia;hypertension;male gender;smoking/ tobacco exposure     Objective:    Today's Vitals   11/06/20 0615  BP: 137/83  Pulse: (!) 57  Weight: 184 lb (83.5 kg)  Height: 5\' 8"  (1.727 m)   Body mass index is 27.98 kg/m.  Advanced Directives 11/06/2020 06/07/2020 10/29/2019 07/20/2019 09/25/2018 06/05/2017 05/26/2017  Does Patient Have a Medical Advance Directive? Yes No Yes Yes No Yes No  Type of Academic librarian - Living will [place];Living will - Living will -  Does patient want to make changes to medical advance directive? No - Patient declined - - No - Patient declined - - -  Copy of [place] in Chart? No - copy requested - - - - - -  Would patient like information on creating a medical advance directive? - No - Patient declined - - No - Patient declined - -    Current Medications (verified) Outpatient Encounter Medications as of 11/06/2020  Medication Sig  . amLODipine (NORVASC) 5 MG tablet Take 5 mg by mouth daily.  Marland Kitchen aspirin 81 MG chewable tablet Chew 81 mg by mouth daily.  . ferrous sulfate 325 (65 FE) MG tablet Take 1 tablet (325 mg total) by mouth 3 (three) times daily with meals.  . furosemide (LASIX) 20 MG tablet Take 1 tablet (20 mg total) by mouth daily as needed for fluid  or edema.  . gabapentin (NEURONTIN) 400 MG capsule Take 1 capsule by mouth at bedtime  . potassium chloride (KLOR-CON) 10 MEQ tablet TAKE ONE TABLET BY MOUTH WHEN YOU TAKE FUROSEMIDE  . [DISCONTINUED] amLODipine (NORVASC) 5 MG tablet TAKE 1 TABLET BY MOUTH IN THE MORNING AND 1 AT BEDTIME  . [DISCONTINUED] ferrous sulfate 325 (65 FE) MG tablet Take 325 mg by mouth 3 (three) times daily with meals.  . nitroGLYCERIN (NITROSTAT) 0.4 MG SL tablet Place 1 tablet (0.4 mg total) under the tongue every 5 (five) minutes x 3 doses as needed for chest pain (if no relief after 3 rd dose, proceed to the ED for an evaluation or call 911).  . traZODone (DESYREL) 150 MG tablet TAKE 1 TABLET BY MOUTH ONCE DAILY FOR SLEEP  . [DISCONTINUED] nitroGLYCERIN (NITROSTAT) 0.4 MG SL tablet Place 1 tablet (0.4 mg total) under the tongue every 5 (five) minutes x 3 doses as needed for chest pain (if no relief after 3 rd dose, proceed to the ED for an evaluation or call 911).   No facility-administered encounter medications on file as of 11/06/2020.    Allergies (verified) Alpha-gal, Meloxicam, Influenza virus vaccine, Ace inhibitors, Gemfibrozil, and Statins   History: Past Medical History:  Diagnosis Date  . Allergy   . Anxiety   . Arthritis   . Cancer ([place])    skin  . Cataract   . GERD (gastroesophageal reflux disease)   . Heart murmur   .  Hyperlipidemia   . Hypertension   . Nephrolithiasis   . Peripheral neuropathy    Past Surgical History:  Procedure Laterality Date  . CERVICAL DISCECTOMY    . CERVICAL FUSION  2008; 2017  . COLONOSCOPY N/A 06/05/2017   Procedure: COLONOSCOPY;  Surgeon: Rogene Houston, MD;  [facility];  Service: Endoscopy;  Laterality: N/A;  1030  . dermatofibrosarcoma    . LEFT HEART CATH AND CORONARY ANGIOGRAPHY N/A 07/20/2019   Procedure: LEFT HEART CATH AND CORONARY ANGIOGRAPHY;  Surgeon: Jettie Booze, MD;  Location: [facility];  Service: Cardiovascular;   Laterality: N/A;  . SPINE SURGERY     neck and low back   Family History  Problem Relation Age of Onset  . Hypertension Father   . Heart disease Father   . Arthritis Father   . Diabetes Father   . Kidney disease Father   . Arthritis Mother   . Heart failure Mother   . Learning disabilities Son   . Mental retardation Son   . Heart attack Paternal Grandmother   . Allergic rhinitis Neg Hx   . Angioedema Neg Hx   . Asthma Neg Hx   . Eczema Neg Hx   . Immunodeficiency Neg Hx   . Urticaria Neg Hx    Social History   Socioeconomic History  . Marital status: Married    Spouse name: Olin Hauser  . Number of children: 2  . Years of education: 36  . Highest education level: Not on file  Occupational History  . Occupation: retired  Tobacco Use  . Smoking status: Former Smoker    Packs/day: 1.00    Types: Cigarettes    Start date: 10/22/1963    Quit date: 08/24/1994    Years since quitting: 26.2  . Smokeless tobacco: Never Used  Vaping Use  . Vaping Use: Never used  Substance and Sexual Activity  . Alcohol use: No    Alcohol/week: 0.0 standard drinks  . Drug use: No  . Sexual activity: Not Currently  Other Topics Concern  . Not on file  Social History Narrative   Lives with wife Olin Hauser and son [name] is mentally handicapped age 26   Walk for exercise   Shoots pistol and reloads own Neurosurgeon      Social Determinants of Health   Financial Resource Strain: Not on file  Food Insecurity: Not on file  Transportation Needs: Not on file  Physical Activity: Not on file  Stress: Not on file  Social Connections: Not on file    Tobacco Counseling Counseling given: Not Answered   Clinical Intake:  Pre-visit preparation completed: Yes  Pain : No/denies pain     BMI - recorded: 27.98 Nutritional Status: BMI 25 -29 Overweight Nutritional Risks: None Diabetes: No  How often do you need to have someone help you when you read instructions, pamphlets, or other  written materials from your doctor or pharmacy?: 1 - Never What is the last grade level you completed in school?: 8th grade  Diabetic? No  Interpreter Needed?: No  Information entered by :: Jeralyn Ruths, NP-C   Activities of Daily Living In your present state of health, do you have any difficulty performing the following activities: 11/06/2020  Hearing? Y  Vision? N  Difficulty concentrating or making decisions? N  Walking or climbing stairs? Y  Dressing or bathing? N  Doing errands, shopping? N  Preparing Food and eating ? N  Using the Toilet?  N  In the past six months, have you accidently leaked urine? N  Do you have problems with loss of bowel control? N  Managing your Medications? N  Managing your Finances? N  Housekeeping or managing your Housekeeping? N  Some recent data might be hidden    Patient Care Team: Wilson SingerGosrani, Nimish C, MD as PCP - General (Internal Medicine) Laqueta LindenKoneswaran, Suresh A, MD (Inactive) as PCP - Cardiology (Cardiology)  Indicate any recent Medical Services you may have received from other than [facility] providers in the past year (date may be approximate).     Assessment:   This is a routine wellness examination for [place].  Hearing/Vision screen No exam data present  Dietary issues and exercise activities discussed: Current Exercise Habits: Home exercise routine, Type of exercise: strength training/weights;walking ([place]), Time (Minutes): 20, Frequency (Times/Week): 7, Weekly Exercise (Minutes/Week): 140, Intensity: Moderate, Exercise limited by: None identified  Goals    . Blood Pressure < 140/90      Depression Screen PHQ 2/9 Scores 11/06/2020 03/15/2020 01/18/2020 12/21/2019 11/22/2019 10/29/2019 08/19/2017  PHQ - 2 Score 0 0 0 0 0 0 0  Exception Documentation - Medical reason Medical reason Medical reason - - -    Fall Risk Fall Risk  11/06/2020 03/15/2020 01/18/2020 12/21/2019 11/22/2019  Falls in the past year? 0 0 0 0 0  Number falls in past yr: 0 0 0  0 0  Injury with Fall? 0 0 0 0 0  Risk for fall due to : No Fall Risks No Fall Risks No Fall Risks No Fall Risks -  Follow up Falls evaluation completed;Education provided;Falls prevention discussed Falls evaluation completed Falls evaluation completed Falls evaluation completed -    FALL RISK PREVENTION PERTAINING TO THE HOME:  Any stairs in or around the home? Yes  If so, are there any without handrails? Yes  Home free of loose throw rugs in walkways, pet beds, electrical cords, etc? Yes  Adequate lighting in your home to reduce risk of falls? Yes   ASSISTIVE DEVICES UTILIZED TO PREVENT FALLS:  Life alert? No  Use of a cane, walker or w/c? Yes - Cane Grab bars in the bathroom? Yes  Shower chair or bench in shower? No  Elevated toilet seat or a handicapped toilet? Yes   TIMED UP AND GO:  Was the test performed? No .  Length of time to ambulate 10 feet:Not applicable.     Cognitive Function:     6CIT Screen 11/06/2020 10/29/2019  What Year? 0 points 0 points  What month? 0 points 0 points  What time? 0 points 0 points  Count back from 20 0 points 0 points  Months in reverse 0 points 0 points  Repeat phrase 0 points 4 points  Total Score 0 4    Immunizations  There is no immunization history on file for this patient.  TDAP status: Due, Education has been provided regarding the importance of this vaccine. Advised may receive this vaccine at local pharmacy or Health Dept. Aware to provide a copy of the vaccination record if obtained from local pharmacy or Health Dept. Verbalized acceptance and understanding.  Flu - Not taken due to allergies  Pneumococcal vaccine status: Declined,  Education has been provided regarding the importance of this vaccine but patient still declined. Advised may receive this vaccine at local pharmacy or Health Dept. Aware to provide a copy of the vaccination record if obtained from local pharmacy or Health Dept. Verbalized acceptance and  understanding.  Covid-19 vaccine status: Declined, Education has been provided regarding the importance of this vaccine but patient still declined. Advised may receive this vaccine at local pharmacy or Health Dept.or vaccine clinic. Aware to provide a copy of the vaccination record if obtained from local pharmacy or Health Dept. Verbalized acceptance and understanding.  Qualifies for Shingles Vaccine? Yes   Zostavax completed No   Shingrix Completed?: No.    Education has been provided regarding the importance of this vaccine. Patient has been advised to call insurance company to determine out of pocket expense if they have not yet received this vaccine. Advised may also receive vaccine at local pharmacy or Health Dept. Verbalized acceptance and understanding.  Screening Tests Health Maintenance  Topic Date Due  . COVID-19 Vaccine (1) Never done  . COLONOSCOPY (Pts 45-77yrs Insurance coverage will need to be confirmed)  06/06/2027  . Hepatitis C Screening  Completed  . INFLUENZA VACCINE  Discontinued  . TETANUS/TDAP  Discontinued  . PNA vac Low Risk Adult  Discontinued    Health Maintenance  Health Maintenance Due  Topic Date Due  . COVID-19 Vaccine (1) Never done    Colorectal cancer screening: Type of screening: Colonoscopy. Completed 05/2017. Repeat every 10 years  Lung Cancer Screening: (Low Dose CT Chest recommended if Age 81-80 years, 30 pack-year currently smoking OR have quit w/in 15years.) does not qualify.   Lung Cancer Screening Referral: N/A  Additional Screening:  Hepatitis C Screening: does not qualify; Completed 07/2019  Vision Screening: Recommended annual ophthalmology exams for early detection of glaucoma and other disorders of the eye. Is the patient up to date with their annual eye exam?  Yes  Who is the provider or what is the name of the office in which the patient attends annual eye exams? [facility] If pt is not established with a provider, would they like  to be referred to a provider to establish care? No .   Dental Screening: Recommended annual dental exams for proper oral hygiene  Community Resource Referral / Chronic Care Management: CRR required this visit?  No   CCM required this visit?  No      Plan:     Due to patient's alpha-gal allergy he tells me he is not able to have the flu shot administered.  In addition he is not interested in having the COVID-19 vaccine administered at this time.  He is not sure of last date of his tetanus shot nor of whether or not he had pneumonia vaccine.  He also tells me has not had shingles vaccine.  He is not sure if he can have these vaccinations due to his alpha-gal allergy.  I will try to reach out to our pharmacist between now and his next appointment to determine whether or not he would be able to take the PPSV23, tetanus, and/or shingles vaccines.  He appears to be up-to-date with all other screening recommendations for male of his age.  I have personally reviewed and noted the following in the patient's chart:   . Medical and social history . Use of alcohol, tobacco or illicit drugs  . Current medications and supplements . Functional ability and status . Nutritional status . Physical activity . Advanced directives . List of other physicians . Hospitalizations, surgeries, and ER visits in previous 12 months . Vitals . Screenings to include cognitive, depression, and falls . Referrals and appointments  In addition, I have reviewed and discussed with patient certain preventive protocols, quality metrics, and best practice  recommendations. A written personalized care plan for preventive services as well as general preventive health recommendations were provided to patient.    He will follow-up as scheduled with me next month, or sooner as needed.  This telephone conversation lasted for 27 minutes.  Ailene Ards, NP   11/06/2020

## 2020-11-08 NOTE — Telephone Encounter (Signed)
Done

## 2020-11-21 ENCOUNTER — Ambulatory Visit (INDEPENDENT_AMBULATORY_CARE_PROVIDER_SITE_OTHER): Payer: Medicare Other | Admitting: Nurse Practitioner

## 2020-11-28 ENCOUNTER — Other Ambulatory Visit (HOSPITAL_COMMUNITY): Payer: Self-pay | Admitting: Nephrology

## 2020-11-28 DIAGNOSIS — E211 Secondary hyperparathyroidism, not elsewhere classified: Secondary | ICD-10-CM

## 2020-11-28 DIAGNOSIS — I129 Hypertensive chronic kidney disease with stage 1 through stage 4 chronic kidney disease, or unspecified chronic kidney disease: Secondary | ICD-10-CM

## 2020-11-28 DIAGNOSIS — N1831 Chronic kidney disease, stage 3a: Secondary | ICD-10-CM

## 2020-11-28 DIAGNOSIS — I5032 Chronic diastolic (congestive) heart failure: Secondary | ICD-10-CM

## 2020-12-06 ENCOUNTER — Ambulatory Visit (INDEPENDENT_AMBULATORY_CARE_PROVIDER_SITE_OTHER): Payer: Medicare Other | Admitting: Nurse Practitioner

## 2020-12-06 ENCOUNTER — Encounter (INDEPENDENT_AMBULATORY_CARE_PROVIDER_SITE_OTHER): Payer: Self-pay | Admitting: Nurse Practitioner

## 2020-12-06 ENCOUNTER — Other Ambulatory Visit: Payer: Self-pay

## 2020-12-06 VITALS — BP 142/80 | HR 58 | Temp 96.8°F | Ht 68.0 in | Wt 188.4 lb

## 2020-12-06 DIAGNOSIS — I2510 Atherosclerotic heart disease of native coronary artery without angina pectoris: Secondary | ICD-10-CM | POA: Diagnosis not present

## 2020-12-06 DIAGNOSIS — N1831 Chronic kidney disease, stage 3a: Secondary | ICD-10-CM

## 2020-12-06 DIAGNOSIS — I10 Essential (primary) hypertension: Secondary | ICD-10-CM

## 2020-12-06 DIAGNOSIS — E782 Mixed hyperlipidemia: Secondary | ICD-10-CM | POA: Diagnosis not present

## 2020-12-06 DIAGNOSIS — G4700 Insomnia, unspecified: Secondary | ICD-10-CM

## 2020-12-06 DIAGNOSIS — M7989 Other specified soft tissue disorders: Secondary | ICD-10-CM

## 2020-12-06 MED ORDER — TRAZODONE HCL 150 MG PO TABS: 150.0000 mg | ORAL_TABLET | Freq: Every evening | ORAL | 0 refills | Status: DC | PRN

## 2020-12-06 MED ORDER — GABAPENTIN 400 MG PO CAPS: 400.0000 mg | ORAL_CAPSULE | Freq: Every day | ORAL | 0 refills | Status: DC

## 2020-12-06 NOTE — Progress Notes (Signed)
Subjective:  Patient ID: Jonathan Gibson, male    DOB: 1951-02-01  Age: 70 y.o. MRN: [id number]  CC:  Chief Complaint  Patient presents with  . Hypertension  . Coronary Artery Disease  . Chronic Kidney Disease  . Hyperlipidemia  . Other    Leg Swelling      HPI  This patient arrives today for the above.  Hypertension/leg swelling: He continues on amlodipine 5 mg daily and tolerates this well.  He was placed on furosemide and potassium that he can take as needed for leg swelling, but tells me he was experiencing significant itching in his legs when he would use his medication so he has stopped taking it.  He tells me that the leg swelling overall is much improved and he only notices it if he is on his feet for prolonged period of time.  He has a cough with ACE inhibitor's, and does not tolerate these.  Coronary artery disease/hyperlipidemia: He uses nitroglycerin as needed for chest pain.  He tells me he has not needed to use nitroglycerin pill here recently.  He only experiences chest pain when he walks an incline for prolonged period time, but as soon as he stops within a few moments the chest pain subsides.  Last lipid panel showed LDL of 113, he does not tolerate statins.  He is on a daily aspirin.  Chronic kidney disease: He does have a history of mild chronic kidney disease stage 3A.  Last metabolic panel was collected when he was in the emergency department for treatment of COVID-19 virus.  He is due to have metabolic panel rechecked today.  He has been referred to nephrology and does follow-up with Dr. Theador Hawthorne.  Of note, he is asking for refill on his trazodone and gabapentin.  Past Medical History:  Diagnosis Date  . Allergy   . Anxiety   . Arthritis   . Cancer ([place])    skin  . Cataract   . GERD (gastroesophageal reflux disease)   . Heart murmur   . Hyperlipidemia   . Hypertension   . Nephrolithiasis   . Peripheral neuropathy       Family History   Problem Relation Age of Onset  . Hypertension Father   . Heart disease Father   . Arthritis Father   . Diabetes Father   . Kidney disease Father   . Arthritis Mother   . Heart failure Mother   . Learning disabilities Son   . Mental retardation Son   . Heart attack Paternal Grandmother   . Allergic rhinitis Neg Hx   . Angioedema Neg Hx   . Asthma Neg Hx   . Eczema Neg Hx   . Immunodeficiency Neg Hx   . Urticaria Neg Hx     Social History   Social History Narrative   Lives with wife Olin Hauser and son [name] is mentally handicapped age 72   Walk for exercise   Shoots pistol and reloads own Neurosurgeon      Social History   Tobacco Use  . Smoking status: Former Smoker    Packs/day: 1.00    Types: Cigarettes    Start date: 10/22/1963    Quit date: 08/24/1994    Years since quitting: 26.3  . Smokeless tobacco: Never Used  Substance Use Topics  . Alcohol use: No    Alcohol/week: 0.0 standard drinks     Current Meds  Medication Sig  . amLODipine (NORVASC) 5 MG  tablet Take 5 mg by mouth daily.  Marland Kitchen aspirin 81 MG chewable tablet Chew 81 mg by mouth daily.  . ferrous sulfate 325 (65 FE) MG tablet Take 1 tablet (325 mg total) by mouth 3 (three) times daily with meals.  . nitroGLYCERIN (NITROSTAT) 0.4 MG SL tablet Place 1 tablet (0.4 mg total) under the tongue every 5 (five) minutes x 3 doses as needed for chest pain (if no relief after 3 rd dose, proceed to the ED for an evaluation or call 911).  . [DISCONTINUED] furosemide (LASIX) 20 MG tablet Take 1 tablet (20 mg total) by mouth daily as needed for fluid or edema.  . [DISCONTINUED] gabapentin (NEURONTIN) 400 MG capsule Take 1 capsule by mouth at bedtime  . [DISCONTINUED] potassium chloride (KLOR-CON) 10 MEQ tablet TAKE ONE TABLET BY MOUTH WHEN YOU TAKE FUROSEMIDE    ROS:  Review of Systems  Constitutional: Negative for fever and malaise/fatigue.  Eyes: Negative for blurred vision.  Respiratory: Negative for cough and  shortness of breath.   Cardiovascular: Negative for chest pain, orthopnea, leg swelling and PND.  Neurological: Negative for dizziness and headaches.     Objective:   Today's Vitals: BP (!) 142/80   Pulse (!) 58   Temp (!) 96.8 F (36 C)   Ht _0  (1.727 m)   Wt 188 lb 6.4 oz (85.5 kg)   SpO2 99%   BMI 28.65 kg/m  Vitals with BMI 12/06/2020 11/06/2020 07/12/2020  Height _1  _2  _3   Weight 188 lbs 6 oz 184 lbs 193 lbs 13 oz  BMI 28.65 32.99 24.26  Systolic 834 196 222  Diastolic 80 83 80  Pulse 58 57 72     Physical Exam Vitals reviewed.  Constitutional:      Appearance: Normal appearance.  HENT:     Head: Normocephalic and atraumatic.  Cardiovascular:     Rate and Rhythm: Normal rate and regular rhythm.  Pulmonary:     Effort: Pulmonary effort is normal.     Breath sounds: Normal breath sounds.  Musculoskeletal:     Cervical back: Neck supple.  Skin:    General: Skin is warm and dry.  Neurological:     Mental Status: He is alert and oriented to person, place, and time.  Psychiatric:        Mood and Affect: Mood normal.        Behavior: Behavior normal.        Thought Content: Thought content normal.        Judgment: Judgment normal.          Assessment and Plan   1. Insomnia, unspecified type   2. Stage 3a chronic kidney disease ([place])   3. Mixed hyperlipidemia   4. Essential hypertension   5. Leg swelling   6. Atherosclerosis of native coronary artery of native heart without angina pectoris      Plan: 1.  We will refill his trazodone gabapentin per his request. 2.  He will continue to follow-up with Dr. Theador Hawthorne as scheduled. 3.  He will continue taking his aspirin, will be due to have lipid panel checked at next office visit. 4.  He will continue on his amlodipine we will monitor his blood pressure closely. 5.  Seems to have resolved, he was encouraged to use compression stockings as needed if he knows he is going to be on his feet for  prolonged period time as well as to elevate his legs at night.  He tells me understands. 6.  He will continue on his aspirin and will take his nitroglycerin as needed.   Tests ordered Orders Placed This Encounter  Procedures  . CMP with eGFR([facility])      Meds ordered this encounter  Medications  . traZODone (DESYREL) 150 MG tablet    Sig: Take 1 tablet (150 mg total) by mouth at bedtime as needed for sleep.    Dispense:  90 tablet    Refill:  0    Order Specific Question:   Supervising Provider    Answer:   Hurshel Party C [[id number]]  . gabapentin (NEURONTIN) 400 MG capsule    Sig: Take 1 capsule (400 mg total) by mouth at bedtime.    Dispense:  90 capsule    Refill:  0    Order Specific Question:   Supervising Provider    Answer:   [name] [[id number]]    Patient to follow-up in 3 months or sooner as needed.  Ailene Ards, NP

## 2020-12-07 ENCOUNTER — Ambulatory Visit (HOSPITAL_COMMUNITY)
Admission: RE | Admit: 2020-12-07 | Discharge: 2020-12-07 | Disposition: A | Discharge status: Home / Self Care | Payer: Medicare Other | Source: Ambulatory Visit | Attending: Nephrology | Admitting: Nephrology

## 2020-12-07 DIAGNOSIS — I129 Hypertensive chronic kidney disease with stage 1 through stage 4 chronic kidney disease, or unspecified chronic kidney disease: Secondary | ICD-10-CM

## 2020-12-07 DIAGNOSIS — N1831 Chronic kidney disease, stage 3a: Secondary | ICD-10-CM

## 2020-12-07 DIAGNOSIS — E211 Secondary hyperparathyroidism, not elsewhere classified: Secondary | ICD-10-CM

## 2020-12-07 DIAGNOSIS — I5032 Chronic diastolic (congestive) heart failure: Secondary | ICD-10-CM | POA: Diagnosis not present

## 2020-12-07 DIAGNOSIS — N281 Cyst of kidney, acquired: Secondary | ICD-10-CM | POA: Diagnosis not present

## 2020-12-08 DIAGNOSIS — Z79899 Other long term (current) drug therapy: Secondary | ICD-10-CM | POA: Diagnosis not present

## 2020-12-08 DIAGNOSIS — E211 Secondary hyperparathyroidism, not elsewhere classified: Secondary | ICD-10-CM | POA: Diagnosis not present

## 2020-12-08 DIAGNOSIS — E611 Iron deficiency: Secondary | ICD-10-CM | POA: Diagnosis not present

## 2020-12-08 DIAGNOSIS — N1831 Chronic kidney disease, stage 3a: Secondary | ICD-10-CM | POA: Diagnosis not present

## 2020-12-08 DIAGNOSIS — I5032 Chronic diastolic (congestive) heart failure: Secondary | ICD-10-CM | POA: Diagnosis not present

## 2020-12-08 DIAGNOSIS — I129 Hypertensive chronic kidney disease with stage 1 through stage 4 chronic kidney disease, or unspecified chronic kidney disease: Secondary | ICD-10-CM | POA: Diagnosis not present

## 2020-12-13 DIAGNOSIS — I129 Hypertensive chronic kidney disease with stage 1 through stage 4 chronic kidney disease, or unspecified chronic kidney disease: Secondary | ICD-10-CM | POA: Diagnosis not present

## 2020-12-13 DIAGNOSIS — N182 Chronic kidney disease, stage 2 (mild): Secondary | ICD-10-CM | POA: Diagnosis not present

## 2020-12-13 DIAGNOSIS — I5032 Chronic diastolic (congestive) heart failure: Secondary | ICD-10-CM | POA: Diagnosis not present

## 2020-12-13 DIAGNOSIS — N281 Cyst of kidney, acquired: Secondary | ICD-10-CM | POA: Diagnosis not present

## 2020-12-17 ENCOUNTER — Other Ambulatory Visit (INDEPENDENT_AMBULATORY_CARE_PROVIDER_SITE_OTHER): Payer: Self-pay | Admitting: Internal Medicine

## 2021-03-07 ENCOUNTER — Other Ambulatory Visit: Payer: Self-pay

## 2021-03-07 ENCOUNTER — Ambulatory Visit (INDEPENDENT_AMBULATORY_CARE_PROVIDER_SITE_OTHER): Payer: Medicare Other | Admitting: Internal Medicine

## 2021-03-07 ENCOUNTER — Encounter (INDEPENDENT_AMBULATORY_CARE_PROVIDER_SITE_OTHER): Payer: Self-pay | Admitting: Internal Medicine

## 2021-03-07 VITALS — BP 138/81 | HR 56 | Temp 97.3°F | Ht 68.0 in | Wt 190.0 lb

## 2021-03-07 DIAGNOSIS — I2510 Atherosclerotic heart disease of native coronary artery without angina pectoris: Secondary | ICD-10-CM

## 2021-03-07 DIAGNOSIS — N1831 Chronic kidney disease, stage 3a: Secondary | ICD-10-CM

## 2021-03-07 DIAGNOSIS — I129 Hypertensive chronic kidney disease with stage 1 through stage 4 chronic kidney disease, or unspecified chronic kidney disease: Secondary | ICD-10-CM | POA: Diagnosis not present

## 2021-03-07 DIAGNOSIS — I5032 Chronic diastolic (congestive) heart failure: Secondary | ICD-10-CM | POA: Diagnosis not present

## 2021-03-07 DIAGNOSIS — I10 Essential (primary) hypertension: Secondary | ICD-10-CM | POA: Diagnosis not present

## 2021-03-07 DIAGNOSIS — N182 Chronic kidney disease, stage 2 (mild): Secondary | ICD-10-CM | POA: Diagnosis not present

## 2021-03-07 DIAGNOSIS — N281 Cyst of kidney, acquired: Secondary | ICD-10-CM | POA: Diagnosis not present

## 2021-03-07 MED ORDER — AMLODIPINE BESYLATE 10 MG PO TABS: 10.0000 mg | ORAL_TABLET | Freq: Every day | ORAL | 1 refills | Status: AC

## 2021-03-07 NOTE — Progress Notes (Signed)
Metrics: Intervention Frequency ACO  Documented Smoking Status Yearly  Screened one or more times in 24 months  Cessation Counseling or  Active cessation medication Past 24 months  Past 24 months   Guideline developer: UpToDate (See UpToDate for funding source) Date Released: 2014       Wellness Office Visit  Subjective:  Patient ID: Jonathan Gibson, male    DOB: [birth year]  Age: 70 y.o. MRN: [id number]  CC: This man comes in for follow-up of hypertension, chronic kidney disease. HPI  He says that he increase his amlodipine 5 mg daily dose to 10 mg daily because his blood pressure readings were elevated without discussing with any medical provider. He continues to follow with Dr. Theador Hawthorne, nephrology, for his chronic kidney disease. He has no specific complaints today. Past Medical History:  Diagnosis Date  . Allergy   . Anxiety   . Arthritis   . Cancer ([place])    skin  . Cataract   . GERD (gastroesophageal reflux disease)   . Heart murmur   . Hyperlipidemia   . Hypertension   . Nephrolithiasis   . Peripheral neuropathy    Past Surgical History:  Procedure Laterality Date  . CERVICAL DISCECTOMY    . CERVICAL FUSION  2008; 2017  . COLONOSCOPY N/A 06/05/2017   Procedure: COLONOSCOPY;  Surgeon: Rogene Houston, MD;  Location: [facility];  Service: Endoscopy;  Laterality: N/A;  1030  . dermatofibrosarcoma    . LEFT HEART CATH AND CORONARY ANGIOGRAPHY N/A 07/20/2019   Procedure: LEFT HEART CATH AND CORONARY ANGIOGRAPHY;  Surgeon: Jettie Booze, MD;  Location: [facility];  Service: Cardiovascular;  Laterality: N/A;  . SPINE SURGERY     neck and low back     Family History  Problem Relation Age of Onset  . Hypertension Father   . Heart disease Father   . Arthritis Father   . Diabetes Father   . Kidney disease Father   . Arthritis Mother   . Heart failure Mother   . Learning disabilities Son   . Mental retardation Son   . Heart attack Paternal  Grandmother   . Allergic rhinitis Neg Hx   . Angioedema Neg Hx   . Asthma Neg Hx   . Eczema Neg Hx   . Immunodeficiency Neg Hx   . Urticaria Neg Hx     Social History   Social History Narrative   Lives with wife Olin Hauser and son [name] is mentally handicapped age 69   Walk for exercise   Shoots pistol and reloads own Neurosurgeon      Social History   Tobacco Use  . Smoking status: Former Smoker    Packs/day: 1.00    Types: Cigarettes    Start date: 10/22/1963    Quit date: 08/24/1994    Years since quitting: 26.5  . Smokeless tobacco: Never Used  Substance Use Topics  . Alcohol use: No    Alcohol/week: 0.0 standard drinks    Current Meds  Medication Sig  . amLODipine (NORVASC) 10 MG tablet Take 1 tablet (10 mg total) by mouth daily.  Marland Kitchen aspirin 81 MG chewable tablet Chew 81 mg by mouth daily.  Marland Kitchen gabapentin (NEURONTIN) 400 MG capsule Take 1 capsule (400 mg total) by mouth at bedtime.  . nitroGLYCERIN (NITROSTAT) 0.4 MG SL tablet Place 1 tablet (0.4 mg total) under the tongue every 5 (five) minutes x 3 doses as needed for chest pain (if no  relief after 3 rd dose, proceed to the ED for an evaluation or call 911).  . traZODone (DESYREL) 150 MG tablet Take 1 tablet (150 mg total) by mouth at bedtime as needed for sleep.  . [DISCONTINUED] amLODipine (NORVASC) 5 MG tablet TAKE 1 TABLET BY MOUTH IN THE MORNING AND 1 TABLET AT BEDTIME       Objective:   Today's Vitals: BP 138/81 (BP Location: Right Arm, Patient Position: Sitting, Cuff Size: Normal)   Pulse (!) 56   Temp (!) 97.3 F (36.3 C) (Temporal)   Ht 5\' 8"  (1.727 m)   Wt 190 lb (86.2 kg)   SpO2 98%   BMI 28.89 kg/m  Vitals with BMI 03/07/2021 12/06/2020 11/06/2020  Height 5\' 8"  5\' 8"  5\' 8"   Weight 190 lbs 188 lbs 6 oz 184 lbs  BMI 28.9 35.00 93.81  Systolic 829 937 169  Diastolic 81 80 83  Pulse 56 58 57     Physical Exam He looks systemically well.  Blood pressure is similar to what it was last time on  amlodipine 5 mg daily.  He is alert and orientated without any focal neurologic signs.      Assessment   1. Essential hypertension   2. Stage 3a chronic kidney disease ([place])       Tests ordered No orders of the defined types were placed in this encounter.    Plan: 1. He will continue with amlodipine 10 mg daily and I have sent a new prescription to reflect this new change.  I have told him and counseled him that he should never make medical decisions without discussing it with a medical provider.  I pointed out to him that if his blood pressure had dropped significantly, he may have had a stroke. 2. Follow-up with Dr. Theador Hawthorne regarding his chronic kidney disease. 3. Follow-up with Judson Roch in about 4 months.   Meds ordered this encounter  Medications  . amLODipine (NORVASC) 10 MG tablet    Sig: Take 1 tablet (10 mg total) by mouth daily.    Dispense:  90 tablet    Refill:  1    Micholas Drumwright Luther Parody, MD

## 2021-03-08 ENCOUNTER — Ambulatory Visit (INDEPENDENT_AMBULATORY_CARE_PROVIDER_SITE_OTHER): Payer: Medicare Other | Admitting: Nurse Practitioner

## 2021-03-10 ENCOUNTER — Other Ambulatory Visit (INDEPENDENT_AMBULATORY_CARE_PROVIDER_SITE_OTHER): Payer: Self-pay | Admitting: Nurse Practitioner

## 2021-03-10 DIAGNOSIS — G4700 Insomnia, unspecified: Secondary | ICD-10-CM

## 2021-03-15 DIAGNOSIS — N281 Cyst of kidney, acquired: Secondary | ICD-10-CM | POA: Diagnosis not present

## 2021-03-15 DIAGNOSIS — Z6829 Body mass index (BMI) 29.0-29.9, adult: Secondary | ICD-10-CM | POA: Diagnosis not present

## 2021-03-15 DIAGNOSIS — I5032 Chronic diastolic (congestive) heart failure: Secondary | ICD-10-CM | POA: Diagnosis not present

## 2021-03-15 DIAGNOSIS — I129 Hypertensive chronic kidney disease with stage 1 through stage 4 chronic kidney disease, or unspecified chronic kidney disease: Secondary | ICD-10-CM | POA: Diagnosis not present

## 2021-03-15 DIAGNOSIS — N182 Chronic kidney disease, stage 2 (mild): Secondary | ICD-10-CM | POA: Diagnosis not present

## 2021-03-19 ENCOUNTER — Other Ambulatory Visit (INDEPENDENT_AMBULATORY_CARE_PROVIDER_SITE_OTHER): Payer: Self-pay | Admitting: Nurse Practitioner

## 2021-03-19 DIAGNOSIS — E782 Mixed hyperlipidemia: Secondary | ICD-10-CM

## 2021-05-22 ENCOUNTER — Other Ambulatory Visit: Payer: Self-pay | Admitting: Nephrology

## 2021-05-22 ENCOUNTER — Other Ambulatory Visit (HOSPITAL_COMMUNITY): Payer: Self-pay | Admitting: Nephrology

## 2021-05-22 DIAGNOSIS — N281 Cyst of kidney, acquired: Secondary | ICD-10-CM

## 2021-05-22 DIAGNOSIS — I5032 Chronic diastolic (congestive) heart failure: Secondary | ICD-10-CM

## 2021-05-22 DIAGNOSIS — Z6829 Body mass index (BMI) 29.0-29.9, adult: Secondary | ICD-10-CM

## 2021-05-22 DIAGNOSIS — N182 Chronic kidney disease, stage 2 (mild): Secondary | ICD-10-CM

## 2021-05-22 DIAGNOSIS — I129 Hypertensive chronic kidney disease with stage 1 through stage 4 chronic kidney disease, or unspecified chronic kidney disease: Secondary | ICD-10-CM

## 2021-06-01 ENCOUNTER — Other Ambulatory Visit: Payer: Self-pay

## 2021-06-01 ENCOUNTER — Ambulatory Visit (HOSPITAL_COMMUNITY)
Admission: RE | Admit: 2021-06-01 | Discharge: 2021-06-01 | Disposition: A | Discharge status: Home / Self Care | Payer: Medicare Other | Source: Ambulatory Visit | Attending: Nephrology | Admitting: Nephrology

## 2021-06-01 DIAGNOSIS — Z6829 Body mass index (BMI) 29.0-29.9, adult: Secondary | ICD-10-CM | POA: Insufficient documentation

## 2021-06-01 DIAGNOSIS — I5032 Chronic diastolic (congestive) heart failure: Secondary | ICD-10-CM | POA: Insufficient documentation

## 2021-06-01 DIAGNOSIS — I129 Hypertensive chronic kidney disease with stage 1 through stage 4 chronic kidney disease, or unspecified chronic kidney disease: Secondary | ICD-10-CM | POA: Insufficient documentation

## 2021-06-01 DIAGNOSIS — N281 Cyst of kidney, acquired: Secondary | ICD-10-CM | POA: Insufficient documentation

## 2021-06-01 DIAGNOSIS — N182 Chronic kidney disease, stage 2 (mild): Secondary | ICD-10-CM | POA: Diagnosis not present

## 2021-06-01 DIAGNOSIS — N2889 Other specified disorders of kidney and ureter: Secondary | ICD-10-CM | POA: Diagnosis not present

## 2021-06-11 ENCOUNTER — Other Ambulatory Visit (INDEPENDENT_AMBULATORY_CARE_PROVIDER_SITE_OTHER): Payer: Self-pay | Admitting: Nurse Practitioner

## 2021-06-11 DIAGNOSIS — I129 Hypertensive chronic kidney disease with stage 1 through stage 4 chronic kidney disease, or unspecified chronic kidney disease: Secondary | ICD-10-CM | POA: Diagnosis not present

## 2021-06-11 DIAGNOSIS — N281 Cyst of kidney, acquired: Secondary | ICD-10-CM | POA: Diagnosis not present

## 2021-06-11 DIAGNOSIS — N182 Chronic kidney disease, stage 2 (mild): Secondary | ICD-10-CM | POA: Diagnosis not present

## 2021-06-11 DIAGNOSIS — Z6829 Body mass index (BMI) 29.0-29.9, adult: Secondary | ICD-10-CM | POA: Diagnosis not present

## 2021-06-11 DIAGNOSIS — I5032 Chronic diastolic (congestive) heart failure: Secondary | ICD-10-CM | POA: Diagnosis not present

## 2021-06-11 DIAGNOSIS — G4700 Insomnia, unspecified: Secondary | ICD-10-CM

## 2021-06-14 DIAGNOSIS — I10 Essential (primary) hypertension: Secondary | ICD-10-CM | POA: Diagnosis not present

## 2021-06-14 DIAGNOSIS — R7303 Prediabetes: Secondary | ICD-10-CM | POA: Diagnosis not present

## 2021-06-14 DIAGNOSIS — G602 Neuropathy in association with hereditary ataxia: Secondary | ICD-10-CM | POA: Diagnosis not present

## 2021-06-14 DIAGNOSIS — Z79899 Other long term (current) drug therapy: Secondary | ICD-10-CM | POA: Diagnosis not present

## 2021-06-14 DIAGNOSIS — G4701 Insomnia due to medical condition: Secondary | ICD-10-CM | POA: Diagnosis not present

## 2021-06-14 DIAGNOSIS — Z6830 Body mass index (BMI) 30.0-30.9, adult: Secondary | ICD-10-CM | POA: Diagnosis not present

## 2021-06-15 DIAGNOSIS — N1831 Chronic kidney disease, stage 3a: Secondary | ICD-10-CM | POA: Diagnosis not present

## 2021-06-15 DIAGNOSIS — N281 Cyst of kidney, acquired: Secondary | ICD-10-CM | POA: Diagnosis not present

## 2021-06-15 DIAGNOSIS — I5032 Chronic diastolic (congestive) heart failure: Secondary | ICD-10-CM | POA: Diagnosis not present

## 2021-06-15 DIAGNOSIS — I129 Hypertensive chronic kidney disease with stage 1 through stage 4 chronic kidney disease, or unspecified chronic kidney disease: Secondary | ICD-10-CM | POA: Diagnosis not present

## 2021-06-15 DIAGNOSIS — Z6829 Body mass index (BMI) 29.0-29.9, adult: Secondary | ICD-10-CM | POA: Diagnosis not present

## 2021-06-30 IMAGING — US US RENAL
1 series · 14 of 25 positions shown · non-contrast
Comparison: Report from abdominal ultrasound 06/17/2008 (images
currently unavailable), report from CT abdomen/pelvis 06/16/2008
(images currently unavailable).

CLINICAL DATA: Chronic kidney disease, stage III A. additional
history provided by scanning technologist: Hypertension.

EXAM:
RENAL / URINARY TRACT ULTRASOUND COMPLETE

[Series 1: us renal · 14 of 53 slices shown]
[im 1/53]
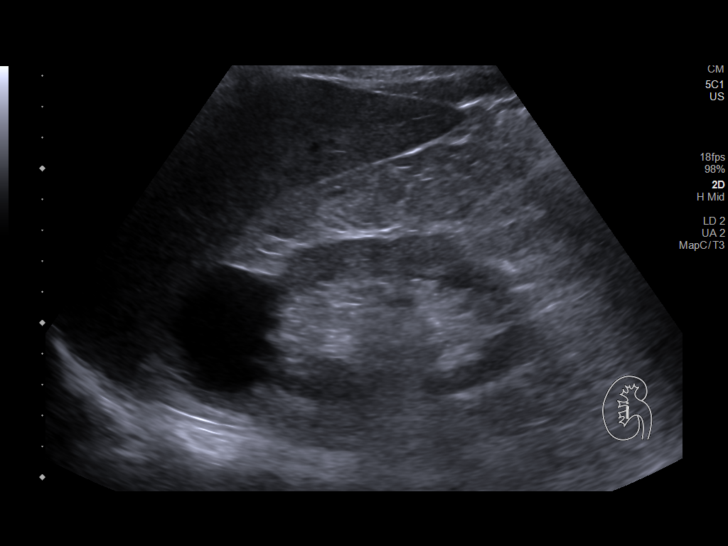
[im 5/53]
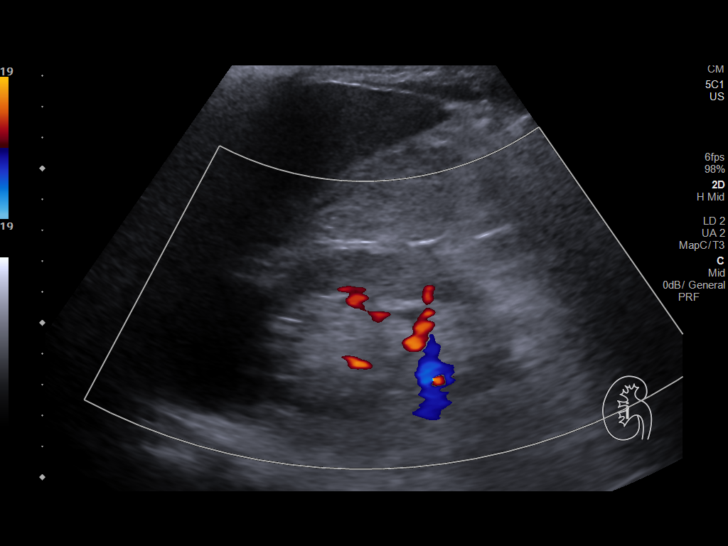
[im 9/53]
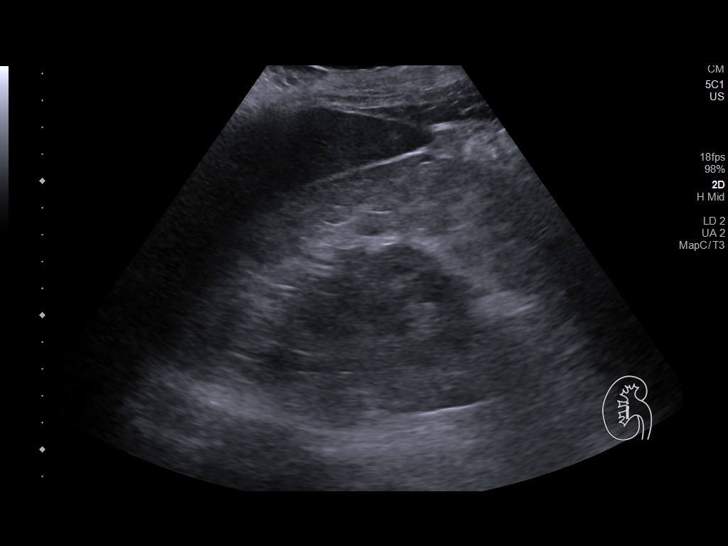
[im 14/53]
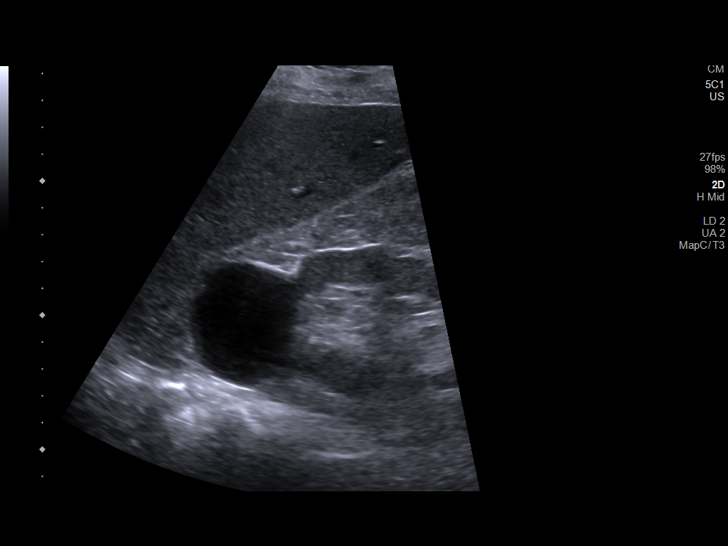
[im 18/53]
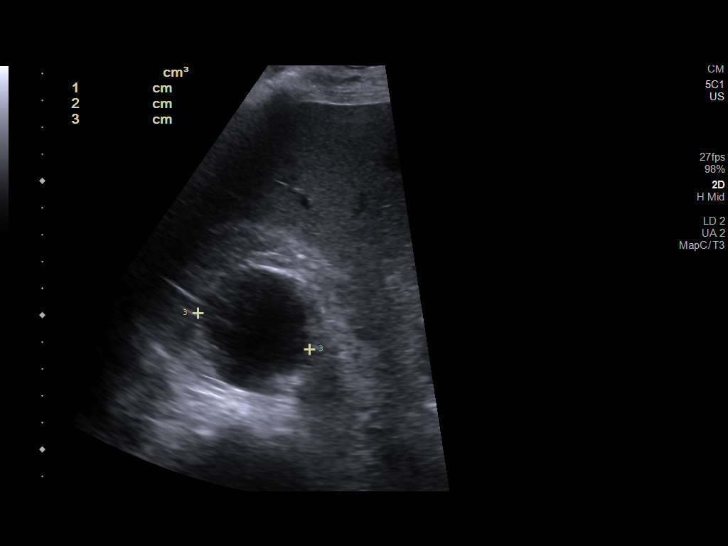
[im 20/53]
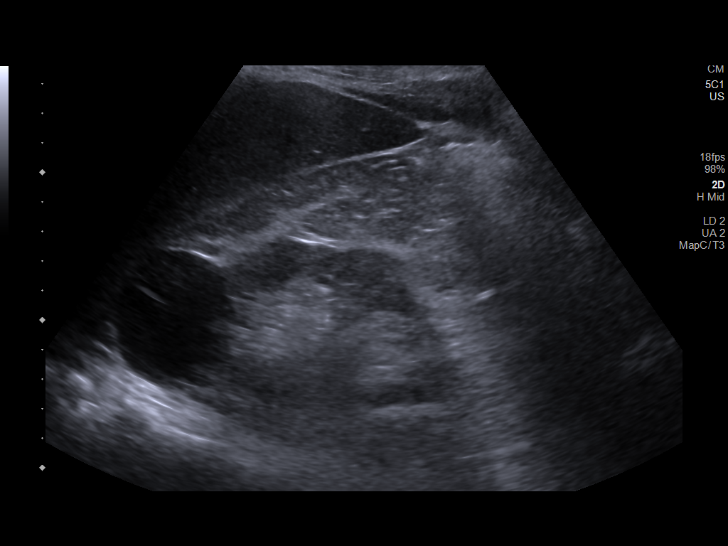
[im 24/53]
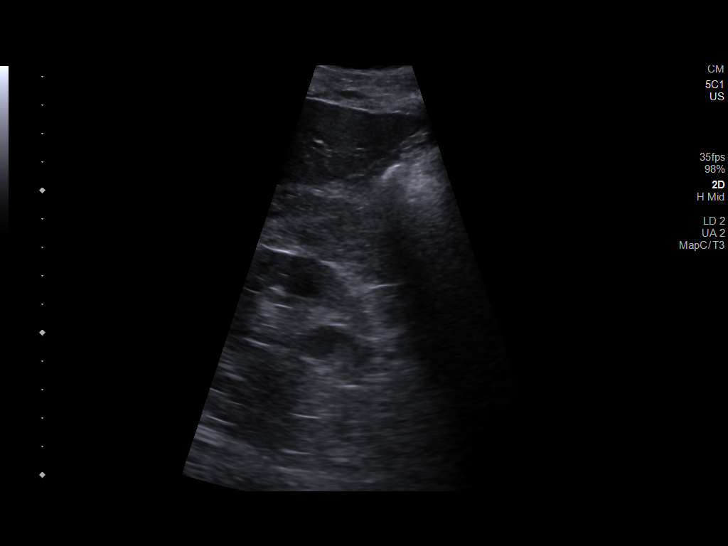
[im 29/53]
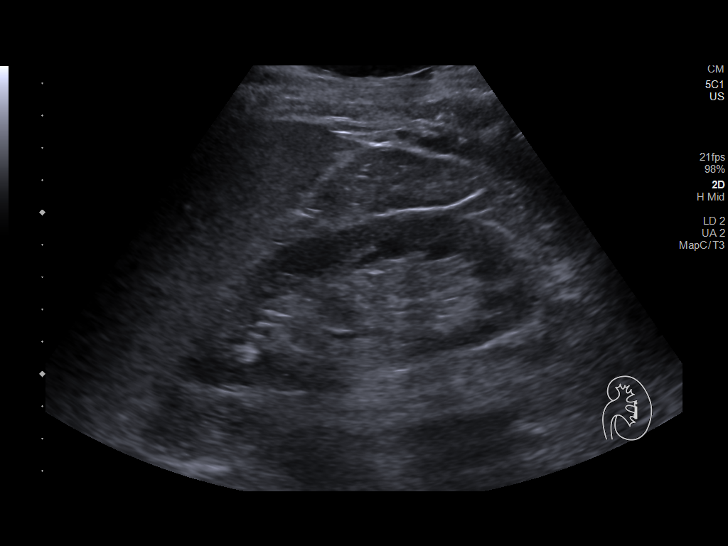
[im 33/53]
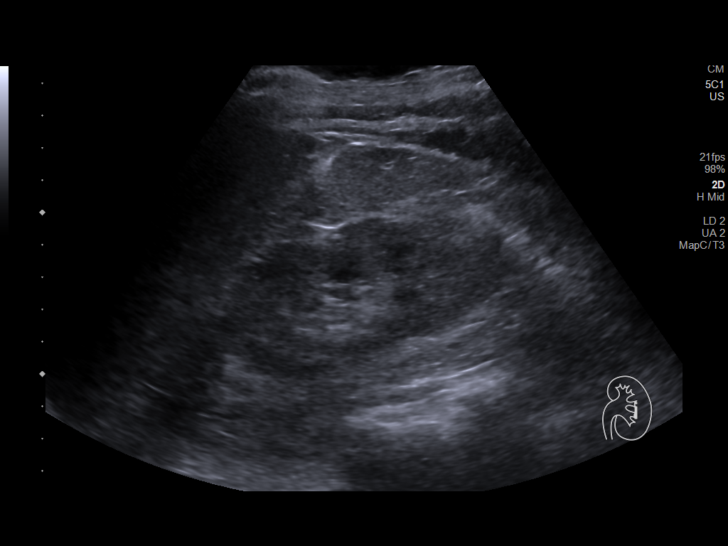
[im 35/53]
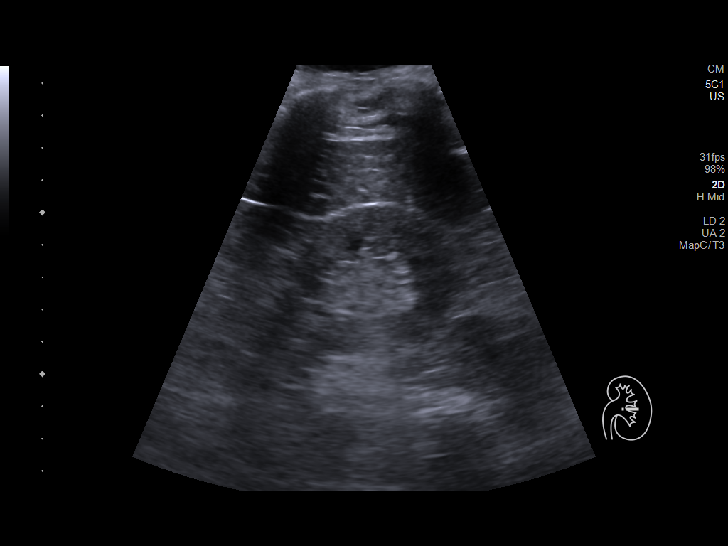
[im 40/53]
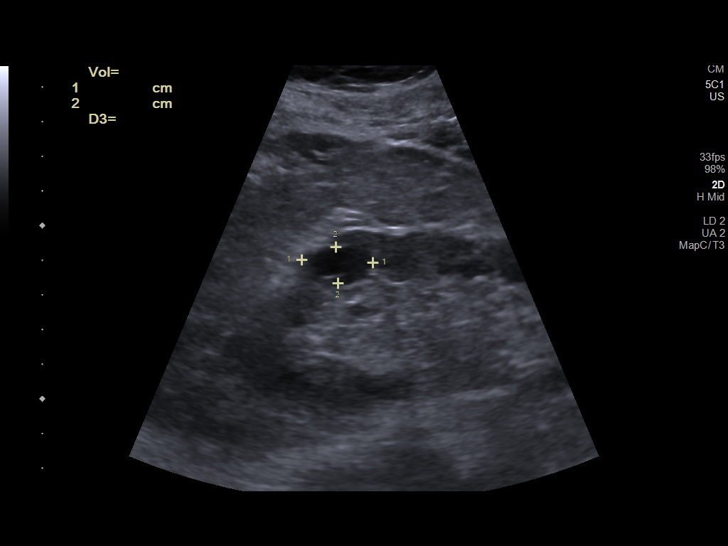
[im 44/53]
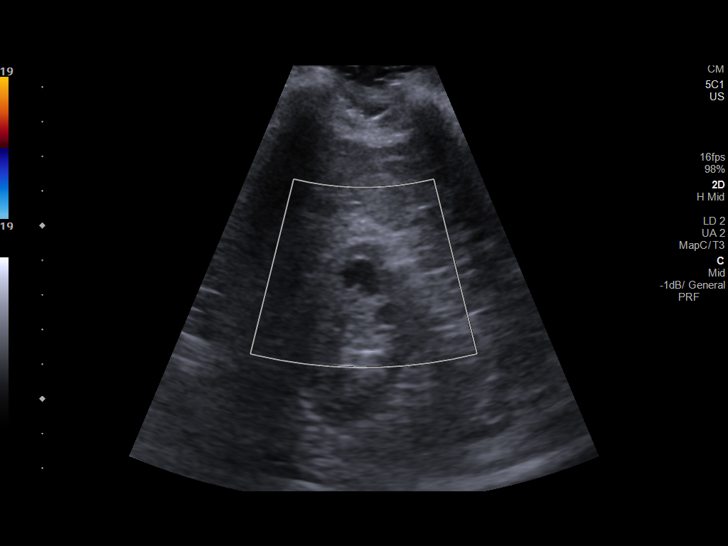
[im 48/53]
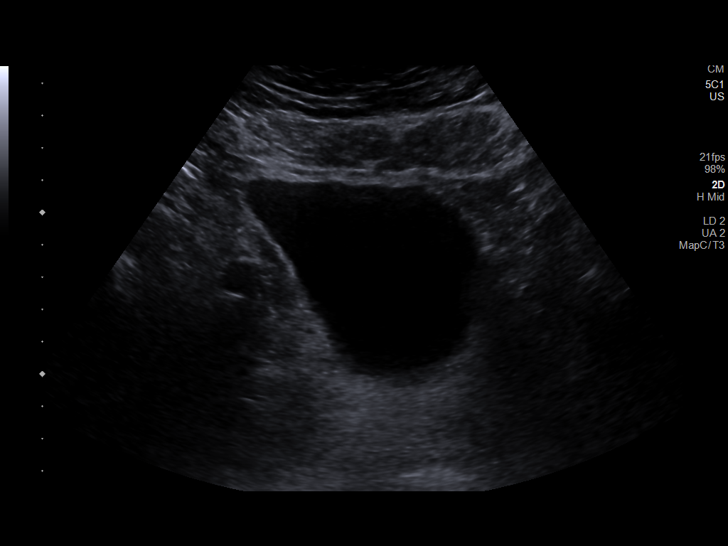
[im 53/53]
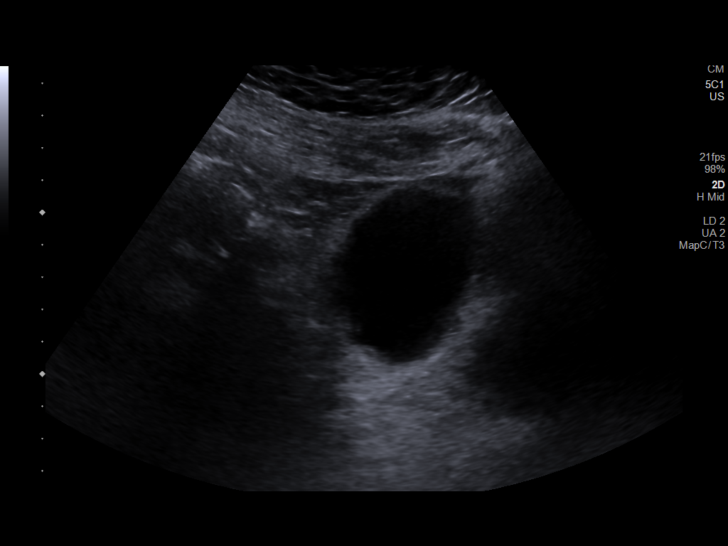

[14 of 25 positions shown; findings below may reference images not displayed]

FINDINGS: Right Kidney:

Renal measurements: 11.0 x 5.5 x 6.7 cm = volume: 211.2 mL.
Increased cortical echogenicity. No hydronephrosis. Simple appearing
upper pole renal cyst measuring 4.2 x 4.7 x 4.4 cm. Simple appearing
interpolar cyst measuring 1.4 x 1.2 x 2.1 cm

Left Kidney:

Renal measurements: 11.6 x 4.7 x 4.4 cm = volume: 125.0 mL.
Increased renal cortical echogenicity. No hydronephrosis. Simple to
minimally complex upper pole cyst (a single thin internal septation
is questioned) measuring 2.1 x 1.1 x 1.1 cm.

Bladder:

Appears normal for degree of bladder distention.
IMPRESSION: Renal volumes as provided.

Increased renal cortical echogenicity bilaterally, consistent with
chronic renal parenchymal disease.

Bilateral renal cysts.

## 2021-07-11 ENCOUNTER — Ambulatory Visit (INDEPENDENT_AMBULATORY_CARE_PROVIDER_SITE_OTHER): Payer: Medicare Other | Admitting: Nurse Practitioner

## 2021-08-06 DIAGNOSIS — L600 Ingrowing nail: Secondary | ICD-10-CM | POA: Diagnosis not present

## 2021-10-18 DIAGNOSIS — I10 Essential (primary) hypertension: Secondary | ICD-10-CM | POA: Diagnosis not present

## 2021-10-18 DIAGNOSIS — G4701 Insomnia due to medical condition: Secondary | ICD-10-CM | POA: Diagnosis not present

## 2021-10-18 DIAGNOSIS — G602 Neuropathy in association with hereditary ataxia: Secondary | ICD-10-CM | POA: Diagnosis not present

## 2021-11-08 ENCOUNTER — Encounter (INDEPENDENT_AMBULATORY_CARE_PROVIDER_SITE_OTHER): Payer: Medicare Other | Admitting: Nurse Practitioner

## 2021-12-17 DIAGNOSIS — I129 Hypertensive chronic kidney disease with stage 1 through stage 4 chronic kidney disease, or unspecified chronic kidney disease: Secondary | ICD-10-CM | POA: Diagnosis not present

## 2021-12-17 DIAGNOSIS — N1831 Chronic kidney disease, stage 3a: Secondary | ICD-10-CM | POA: Diagnosis not present

## 2021-12-17 DIAGNOSIS — Z6829 Body mass index (BMI) 29.0-29.9, adult: Secondary | ICD-10-CM | POA: Diagnosis not present

## 2021-12-17 DIAGNOSIS — N281 Cyst of kidney, acquired: Secondary | ICD-10-CM | POA: Diagnosis not present

## 2021-12-17 DIAGNOSIS — I5032 Chronic diastolic (congestive) heart failure: Secondary | ICD-10-CM | POA: Diagnosis not present

## 2022-01-12 DIAGNOSIS — E211 Secondary hyperparathyroidism, not elsewhere classified: Secondary | ICD-10-CM | POA: Diagnosis not present

## 2022-01-12 DIAGNOSIS — I5032 Chronic diastolic (congestive) heart failure: Secondary | ICD-10-CM | POA: Diagnosis not present

## 2022-01-12 DIAGNOSIS — I129 Hypertensive chronic kidney disease with stage 1 through stage 4 chronic kidney disease, or unspecified chronic kidney disease: Secondary | ICD-10-CM | POA: Diagnosis not present

## 2022-01-12 DIAGNOSIS — Z6830 Body mass index (BMI) 30.0-30.9, adult: Secondary | ICD-10-CM | POA: Diagnosis not present

## 2022-01-12 DIAGNOSIS — N1831 Chronic kidney disease, stage 3a: Secondary | ICD-10-CM | POA: Diagnosis not present

## 2022-02-09 IMAGING — DX DG CHEST 1V PORT
1 series · 1 of 1 positions shown · non-contrast
Comparison: 11/14/2006

CLINICAL DATA: Cough, nausea and vomiting.  COVID positive.

EXAM:
PORTABLE CHEST 1 VIEW

[chest ap]
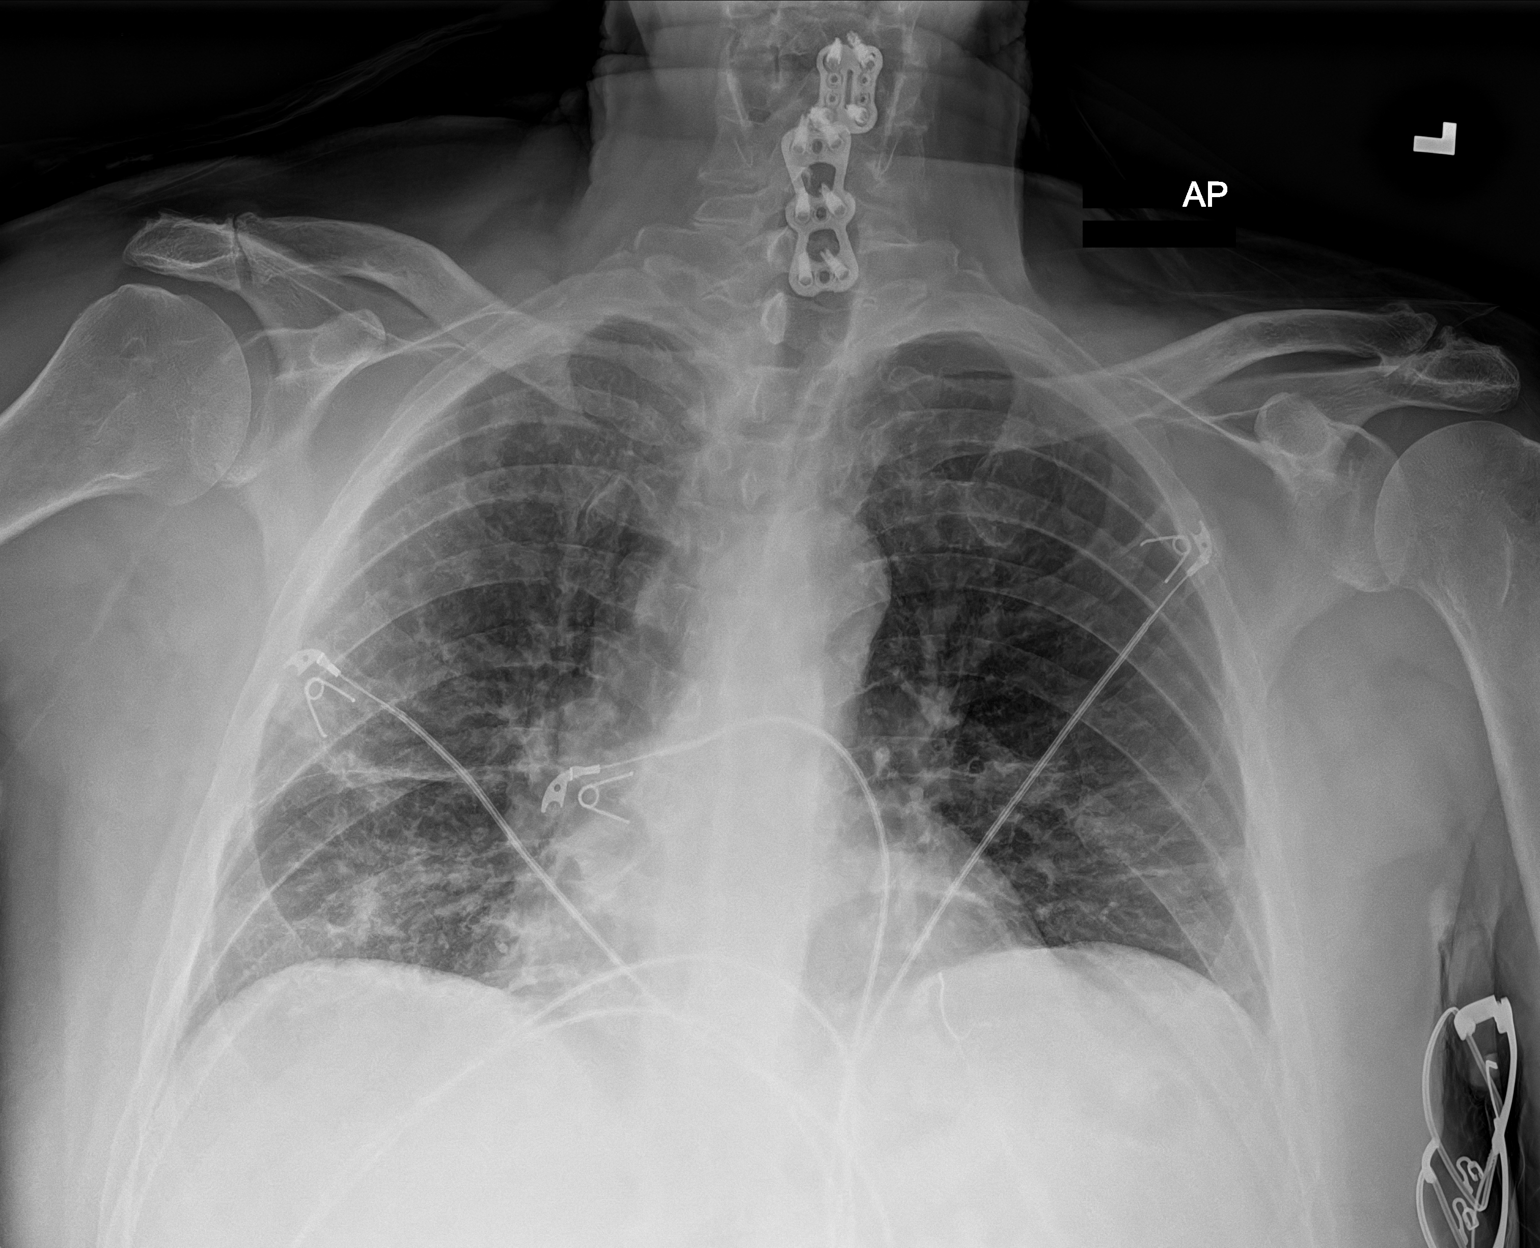

[1 of 1 positions shown; findings below may reference images not displayed]

FINDINGS: Lung volumes are low. Heart size is normal. Normal mediastinal
contours. Mild patchy bilateral airspace opacities in both lung
bases and periphery of the right mid lung no pleural fluid or
pneumothorax. Surgical hardware in the lower cervical spine
partially included.
IMPRESSION: Low lung volumes with mild patchy bilateral airspace opacities,
pattern typical of [id number] pneumonia.

## 2022-02-14 DIAGNOSIS — G602 Neuropathy in association with hereditary ataxia: Secondary | ICD-10-CM | POA: Diagnosis not present

## 2022-02-14 DIAGNOSIS — Z125 Encounter for screening for malignant neoplasm of prostate: Secondary | ICD-10-CM | POA: Diagnosis not present

## 2022-02-14 DIAGNOSIS — Z1331 Encounter for screening for depression: Secondary | ICD-10-CM | POA: Diagnosis not present

## 2022-02-14 DIAGNOSIS — G4701 Insomnia due to medical condition: Secondary | ICD-10-CM | POA: Diagnosis not present

## 2022-02-14 DIAGNOSIS — I10 Essential (primary) hypertension: Secondary | ICD-10-CM | POA: Diagnosis not present

## 2022-02-14 DIAGNOSIS — Z0000 Encounter for general adult medical examination without abnormal findings: Secondary | ICD-10-CM | POA: Diagnosis not present

## 2022-03-03 DIAGNOSIS — T161XXA Foreign body in right ear, initial encounter: Secondary | ICD-10-CM | POA: Diagnosis not present

## 2022-05-23 ENCOUNTER — Other Ambulatory Visit: Payer: Self-pay | Admitting: Nephrology

## 2022-05-23 ENCOUNTER — Other Ambulatory Visit (HOSPITAL_COMMUNITY): Payer: Self-pay | Admitting: Nephrology

## 2022-05-23 DIAGNOSIS — N182 Chronic kidney disease, stage 2 (mild): Secondary | ICD-10-CM

## 2022-05-30 ENCOUNTER — Ambulatory Visit (HOSPITAL_COMMUNITY)
Admission: RE | Admit: 2022-05-30 | Discharge: 2022-05-30 | Disposition: A | Discharge status: Home / Self Care | Payer: Medicare Other | Source: Ambulatory Visit | Attending: Nephrology | Admitting: Nephrology

## 2022-05-30 DIAGNOSIS — N182 Chronic kidney disease, stage 2 (mild): Secondary | ICD-10-CM | POA: Insufficient documentation

## 2022-05-30 DIAGNOSIS — N189 Chronic kidney disease, unspecified: Secondary | ICD-10-CM | POA: Diagnosis not present

## 2022-06-17 DIAGNOSIS — G4701 Insomnia due to medical condition: Secondary | ICD-10-CM | POA: Diagnosis not present

## 2022-06-17 DIAGNOSIS — G602 Neuropathy in association with hereditary ataxia: Secondary | ICD-10-CM | POA: Diagnosis not present

## 2022-06-17 DIAGNOSIS — I10 Essential (primary) hypertension: Secondary | ICD-10-CM | POA: Diagnosis not present

## 2022-06-27 DIAGNOSIS — Z6830 Body mass index (BMI) 30.0-30.9, adult: Secondary | ICD-10-CM | POA: Diagnosis not present

## 2022-06-27 DIAGNOSIS — I5032 Chronic diastolic (congestive) heart failure: Secondary | ICD-10-CM | POA: Diagnosis not present

## 2022-06-27 DIAGNOSIS — I129 Hypertensive chronic kidney disease with stage 1 through stage 4 chronic kidney disease, or unspecified chronic kidney disease: Secondary | ICD-10-CM | POA: Diagnosis not present

## 2022-06-27 DIAGNOSIS — E211 Secondary hyperparathyroidism, not elsewhere classified: Secondary | ICD-10-CM | POA: Diagnosis not present

## 2022-06-27 DIAGNOSIS — N1831 Chronic kidney disease, stage 3a: Secondary | ICD-10-CM | POA: Diagnosis not present

## 2022-07-03 DIAGNOSIS — I129 Hypertensive chronic kidney disease with stage 1 through stage 4 chronic kidney disease, or unspecified chronic kidney disease: Secondary | ICD-10-CM | POA: Diagnosis not present

## 2022-07-03 DIAGNOSIS — I5032 Chronic diastolic (congestive) heart failure: Secondary | ICD-10-CM | POA: Diagnosis not present

## 2022-07-03 DIAGNOSIS — Z6828 Body mass index (BMI) 28.0-28.9, adult: Secondary | ICD-10-CM | POA: Diagnosis not present

## 2022-07-03 DIAGNOSIS — N1831 Chronic kidney disease, stage 3a: Secondary | ICD-10-CM | POA: Diagnosis not present

## 2022-07-03 DIAGNOSIS — N281 Cyst of kidney, acquired: Secondary | ICD-10-CM | POA: Diagnosis not present

## 2022-07-08 DIAGNOSIS — Z961 Presence of intraocular lens: Secondary | ICD-10-CM | POA: Diagnosis not present

## 2022-07-08 DIAGNOSIS — H43822 Vitreomacular adhesion, left eye: Secondary | ICD-10-CM | POA: Diagnosis not present

## 2022-07-08 DIAGNOSIS — H524 Presbyopia: Secondary | ICD-10-CM | POA: Diagnosis not present

## 2022-07-08 DIAGNOSIS — H43813 Vitreous degeneration, bilateral: Secondary | ICD-10-CM | POA: Diagnosis not present

## 2022-07-08 DIAGNOSIS — H52223 Regular astigmatism, bilateral: Secondary | ICD-10-CM | POA: Diagnosis not present

## 2022-07-08 DIAGNOSIS — H5213 Myopia, bilateral: Secondary | ICD-10-CM | POA: Diagnosis not present

## 2022-07-18 DIAGNOSIS — H43813 Vitreous degeneration, bilateral: Secondary | ICD-10-CM | POA: Diagnosis not present

## 2022-07-18 DIAGNOSIS — H43393 Other vitreous opacities, bilateral: Secondary | ICD-10-CM | POA: Diagnosis not present

## 2022-07-18 DIAGNOSIS — H35363 Drusen (degenerative) of macula, bilateral: Secondary | ICD-10-CM | POA: Diagnosis not present

## 2022-07-18 DIAGNOSIS — H35372 Puckering of macula, left eye: Secondary | ICD-10-CM | POA: Diagnosis not present

## 2022-08-11 IMAGING — US US RENAL
1 series · 13 of 25 positions shown · non-contrast
Comparison: 07/27/2020

CLINICAL DATA: Stage IIIa chronic kidney disease, benign
hypertensive renal disease, chronic diastolic CHF, secondary
hyperparathyroidism

EXAM:
RENAL / URINARY TRACT ULTRASOUND COMPLETE

[Series 1: us renal · 13 of 45 slices shown]
[im 1/45]
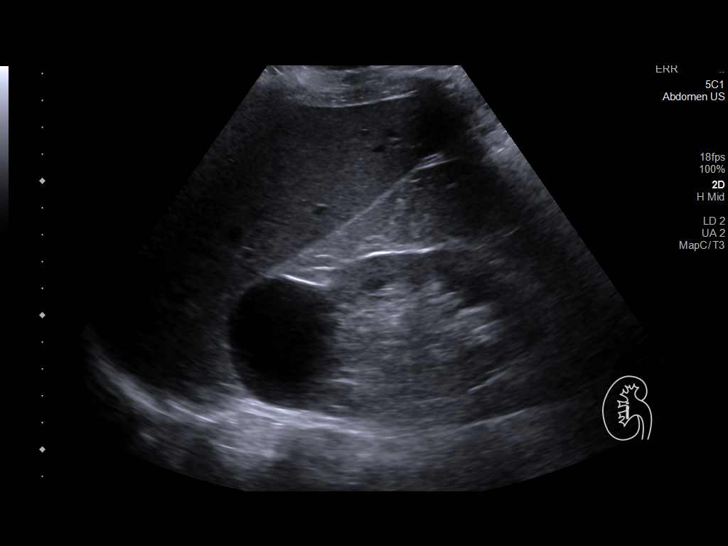
[im 4/45]
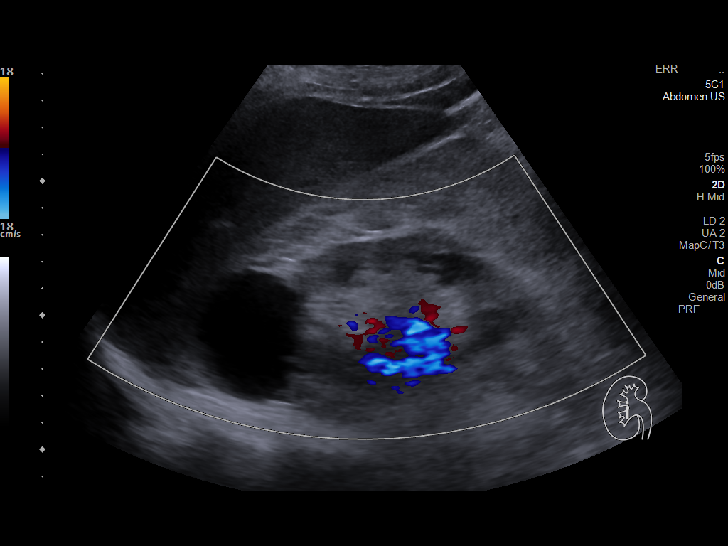
[im 8/45]
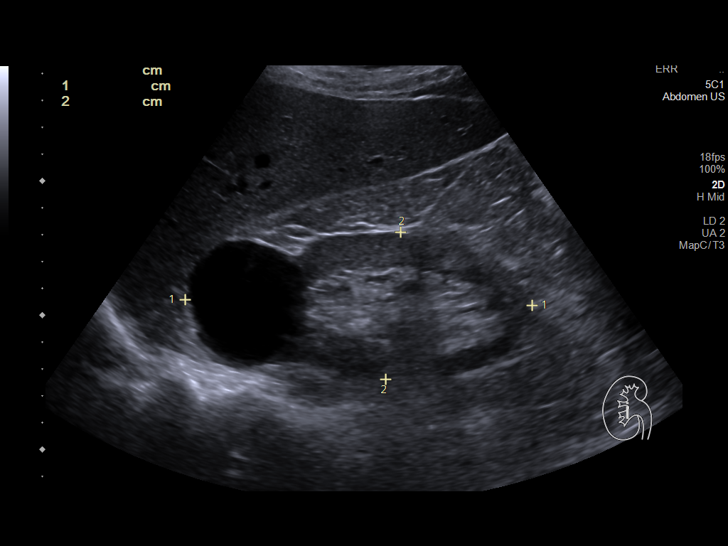
[im 12/45]
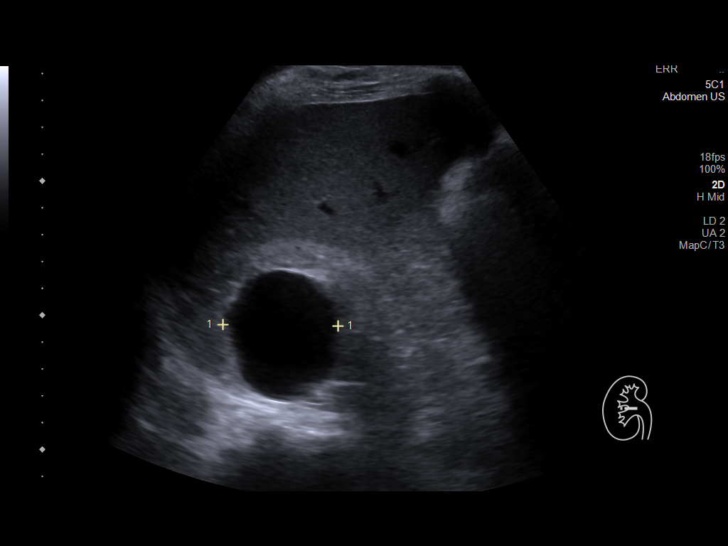
[im 15/45]
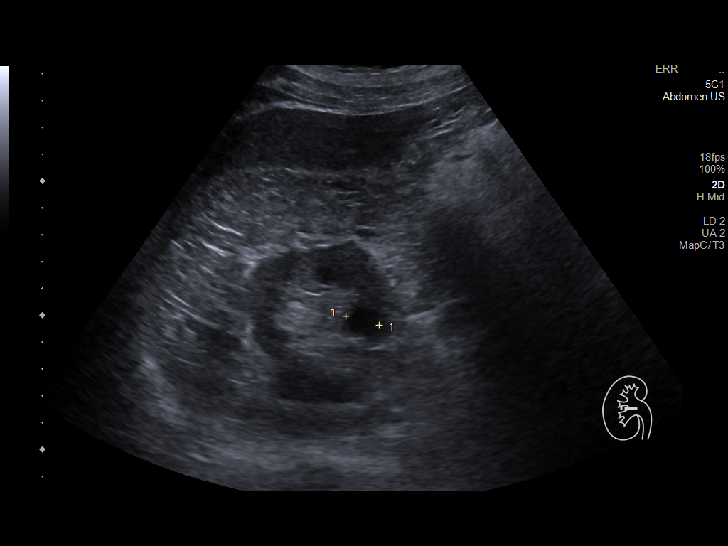
[im 19/45]
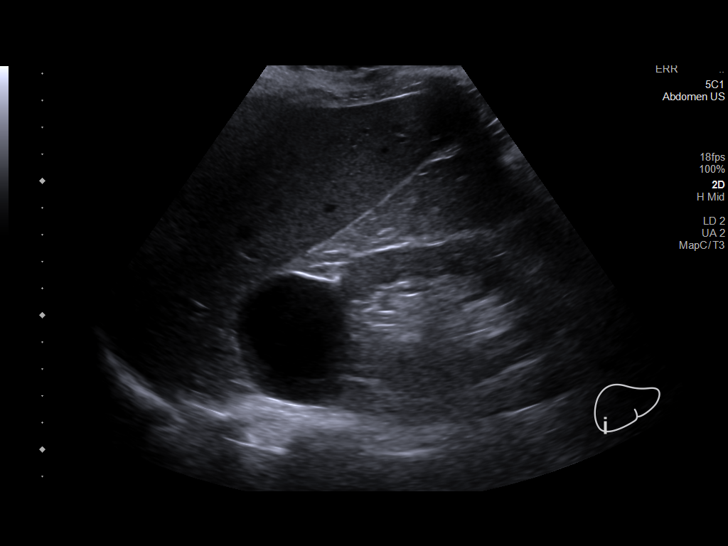
[im 23/45]
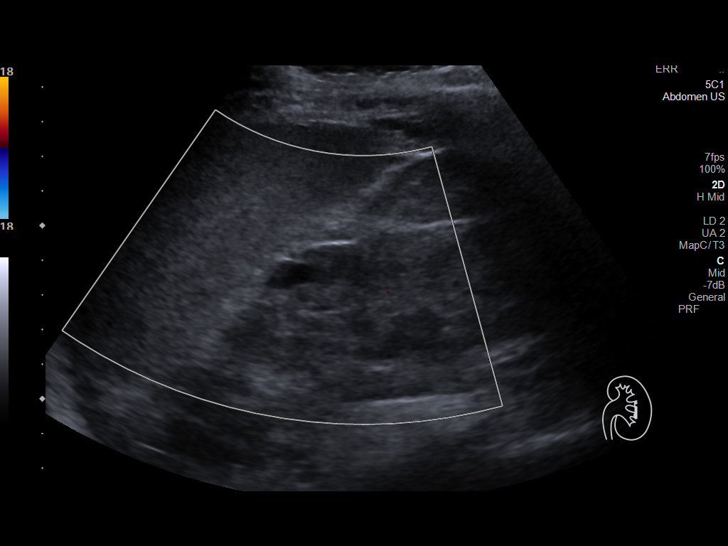
[im 26/45]
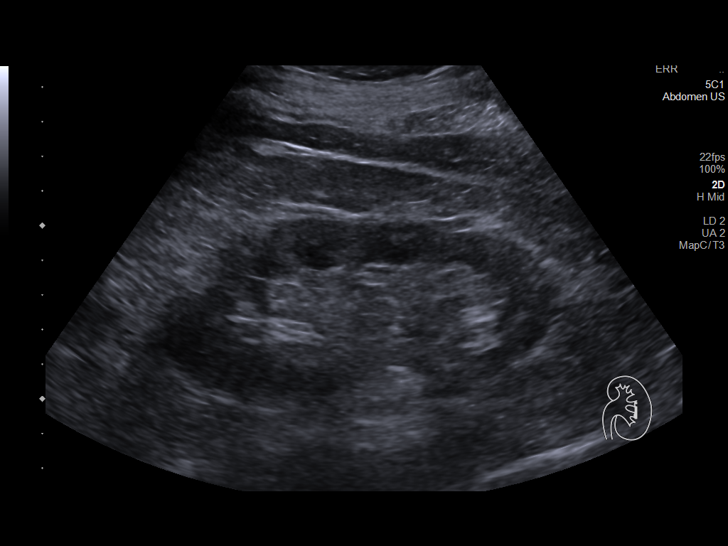
[im 30/45]
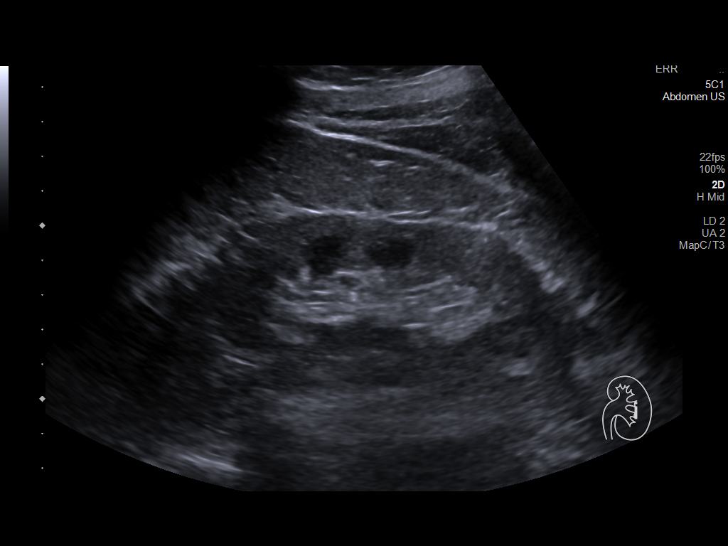
[im 34/45]
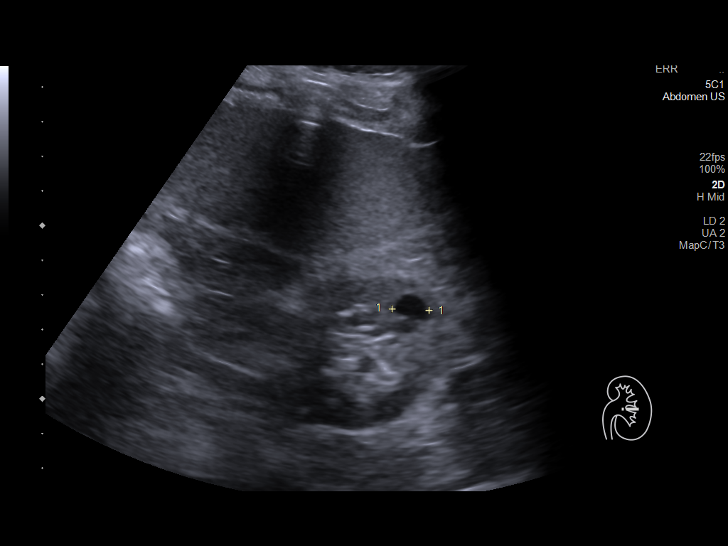
[im 37/45]
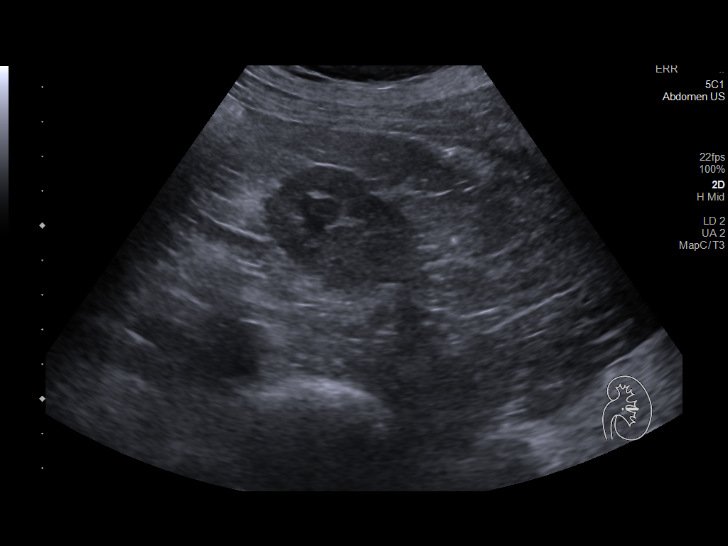
[im 41/45]
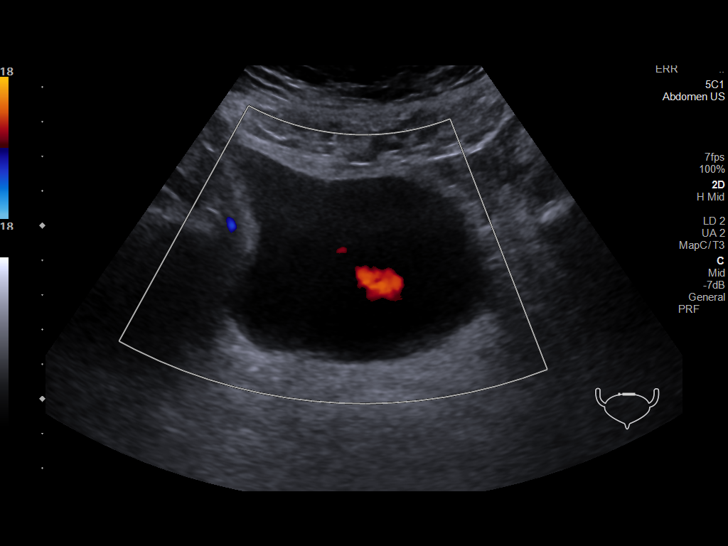
[im 45/45]
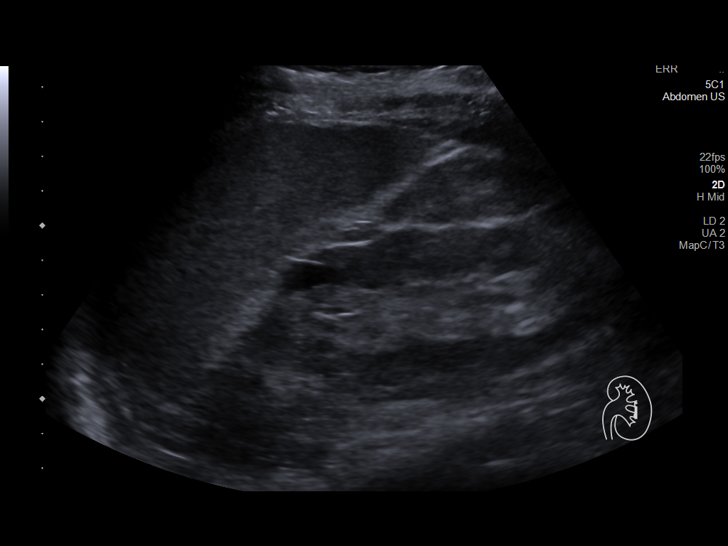

[13 of 25 positions shown; findings below may reference images not displayed]

FINDINGS: Right Kidney:

Renal measurements: 12.9 x 5.5 x 5.3 cm = volume: 207 mL. Cortical
thinning. Increased cortical echogenicity. Cyst at upper pole RIGHT
kidney 5.6 x 4.5 x 4.3 cm, simple features. Minimally complicated
cyst at mid inferior pole RIGHT kidney 1.6 x 1.4 x 1.3 cm containing
a thin septation, measuring slightly smaller than on the previous
exam. No solid mass or hydronephrosis. No shadowing calcification.

Left Kidney:

Renal measurements: 11.6 x 4.6 x 4.5 cm = volume: 124 mL. Cortical
thinning. Mildly increased cortical echogenicity. Small cyst at
upper pole 1.6 x 0.9 x 1.0 cm. No additional mass or hydronephrosis.
No shadowing calcification.

Bladder:

Appears normal for degree of bladder distention. BILATERAL ureteral
jets visualized.

Other:

N/A
IMPRESSION: Medical renal disease changes of both kidneys.

BILATERAL renal cysts, with a minimally complicated cyst at mid
inferior RIGHT kidney 1.6 cm greatest size containing a thin
septation measuring slightly smaller in size versus prior study.

Remainder of exam unremarkable.

## 2022-10-17 DIAGNOSIS — Z6829 Body mass index (BMI) 29.0-29.9, adult: Secondary | ICD-10-CM | POA: Diagnosis not present

## 2022-10-17 DIAGNOSIS — R7303 Prediabetes: Secondary | ICD-10-CM | POA: Diagnosis not present

## 2022-10-17 DIAGNOSIS — G602 Neuropathy in association with hereditary ataxia: Secondary | ICD-10-CM | POA: Diagnosis not present

## 2022-10-17 DIAGNOSIS — I10 Essential (primary) hypertension: Secondary | ICD-10-CM | POA: Diagnosis not present

## 2022-10-17 DIAGNOSIS — G4701 Insomnia due to medical condition: Secondary | ICD-10-CM | POA: Diagnosis not present

## 2022-12-10 DIAGNOSIS — Z6829 Body mass index (BMI) 29.0-29.9, adult: Secondary | ICD-10-CM | POA: Diagnosis not present

## 2022-12-10 DIAGNOSIS — L500 Allergic urticaria: Secondary | ICD-10-CM | POA: Diagnosis not present

## 2022-12-10 DIAGNOSIS — I10 Essential (primary) hypertension: Secondary | ICD-10-CM | POA: Diagnosis not present

## 2023-01-14 DIAGNOSIS — N281 Cyst of kidney, acquired: Secondary | ICD-10-CM | POA: Diagnosis not present

## 2023-01-14 DIAGNOSIS — I5032 Chronic diastolic (congestive) heart failure: Secondary | ICD-10-CM | POA: Diagnosis not present

## 2023-01-14 DIAGNOSIS — I129 Hypertensive chronic kidney disease with stage 1 through stage 4 chronic kidney disease, or unspecified chronic kidney disease: Secondary | ICD-10-CM | POA: Diagnosis not present

## 2023-01-14 DIAGNOSIS — N1831 Chronic kidney disease, stage 3a: Secondary | ICD-10-CM | POA: Diagnosis not present

## 2023-01-20 DIAGNOSIS — Z6828 Body mass index (BMI) 28.0-28.9, adult: Secondary | ICD-10-CM | POA: Diagnosis not present

## 2023-01-20 DIAGNOSIS — I129 Hypertensive chronic kidney disease with stage 1 through stage 4 chronic kidney disease, or unspecified chronic kidney disease: Secondary | ICD-10-CM | POA: Diagnosis not present

## 2023-01-20 DIAGNOSIS — N1831 Chronic kidney disease, stage 3a: Secondary | ICD-10-CM | POA: Diagnosis not present

## 2023-01-20 DIAGNOSIS — I5032 Chronic diastolic (congestive) heart failure: Secondary | ICD-10-CM | POA: Diagnosis not present

## 2023-02-03 IMAGING — US US RENAL
1 series · 13 of 25 positions shown · non-contrast
Comparison: [DATE] [DATE], [DATE], [DATE] [DATE], [DATE]

CLINICAL DATA: Chronic kidney disease, stage 2 (mild); complex
renal cyst

EXAM:
RENAL / URINARY TRACT ULTRASOUND COMPLETE

[Series 1: us renal · 0.26mm/px · 13 of 62 slices shown]
[im 1/62]
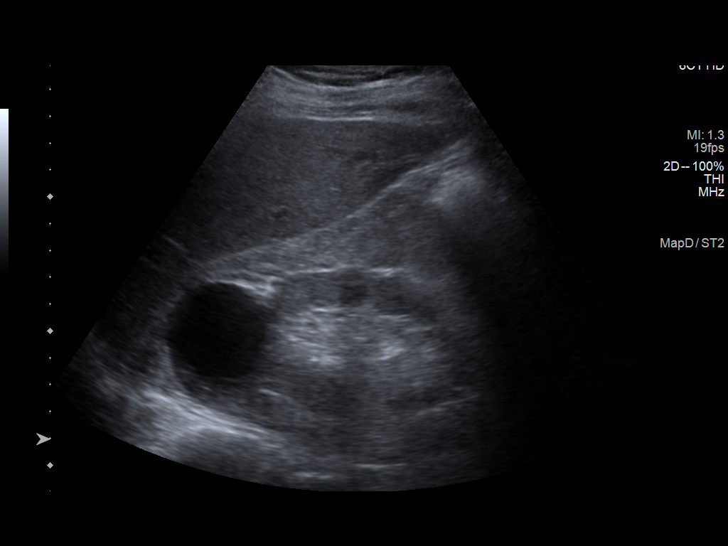
[im 6/62]
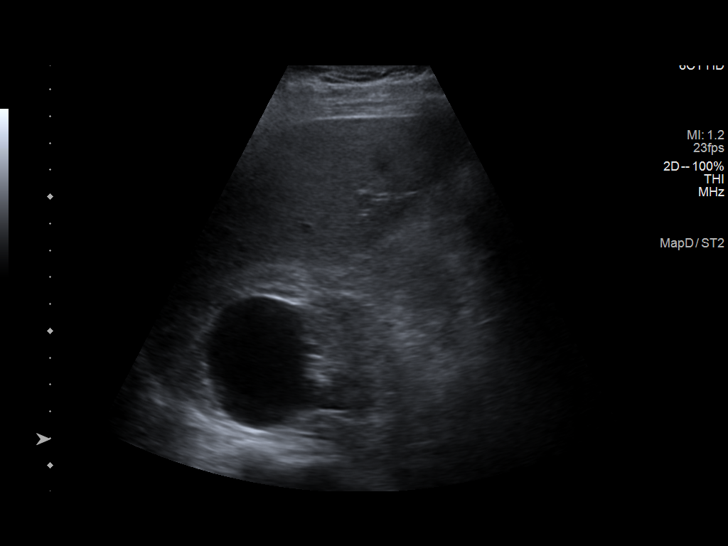
[im 11/62]
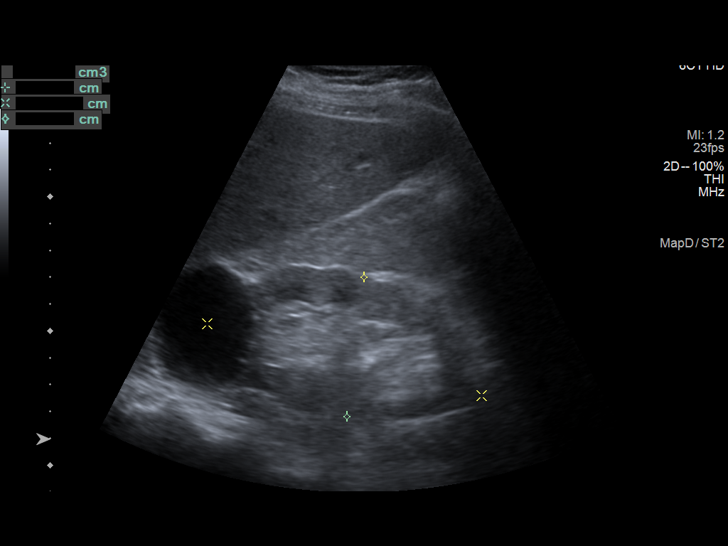
[im 16/62]
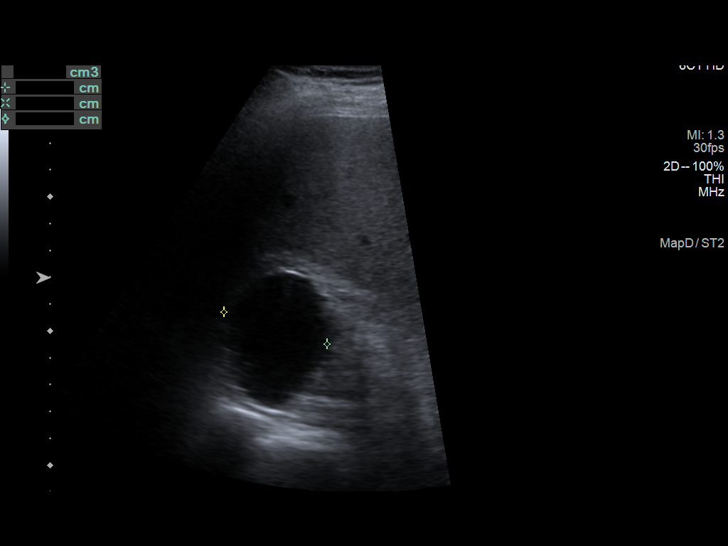
[im 21/62]
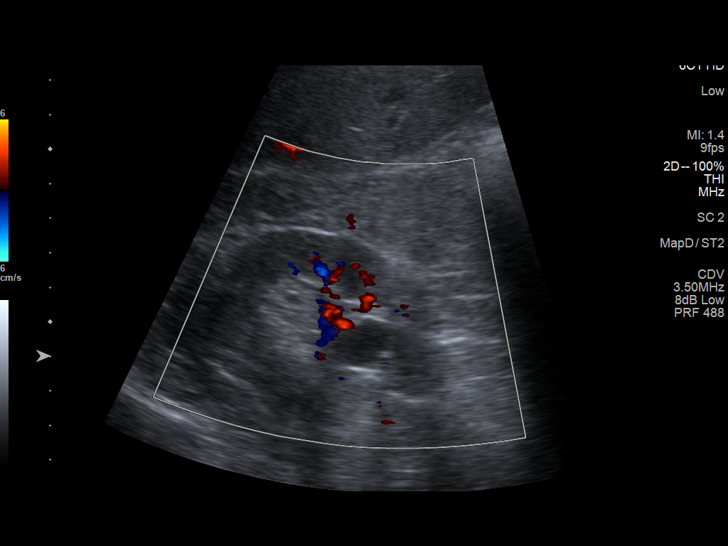
[im 26/62]
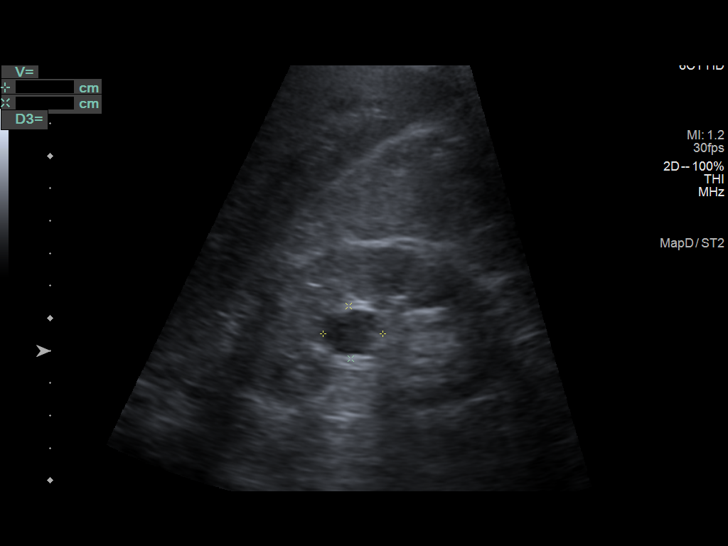
[im 31/62]
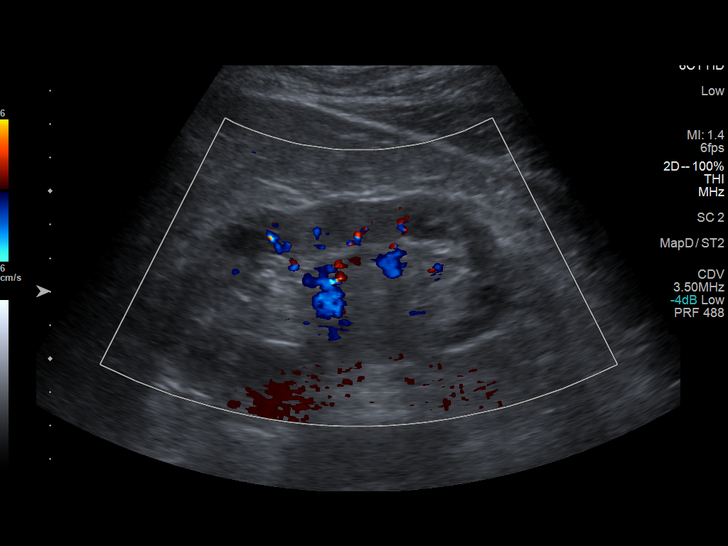
[im 36/62]
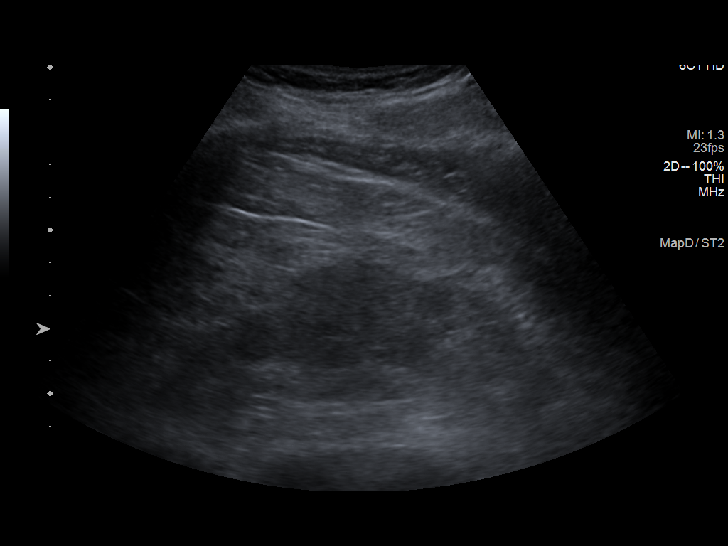
[im 41/62]
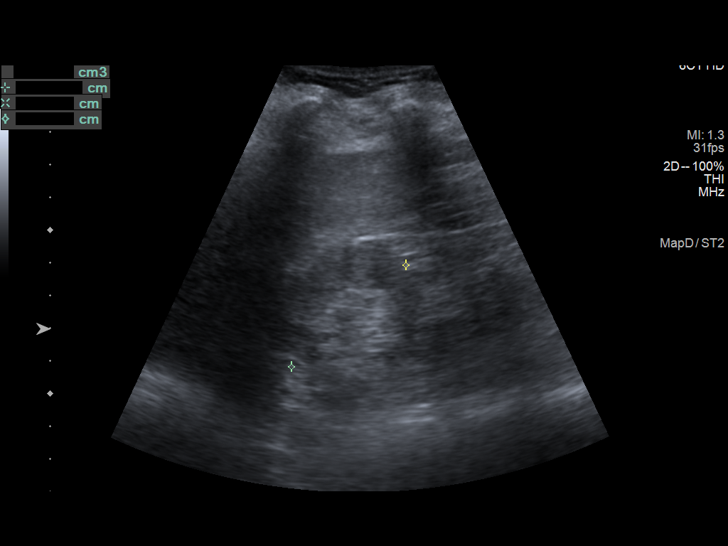
[im 46/62]
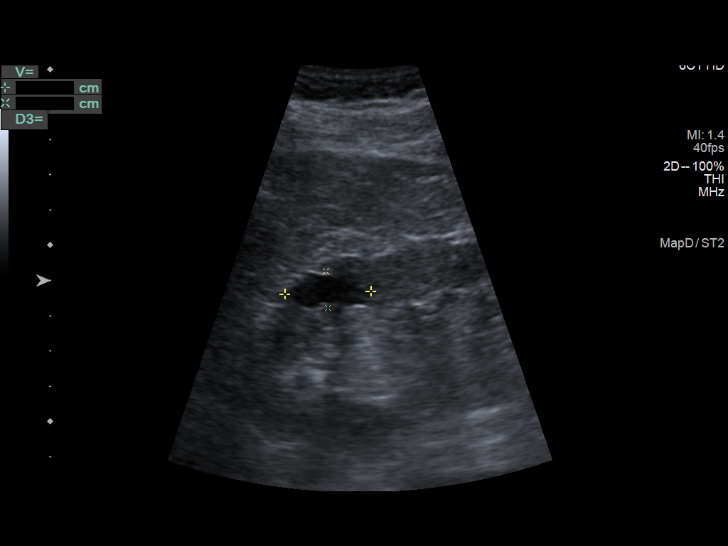
[im 51/62]
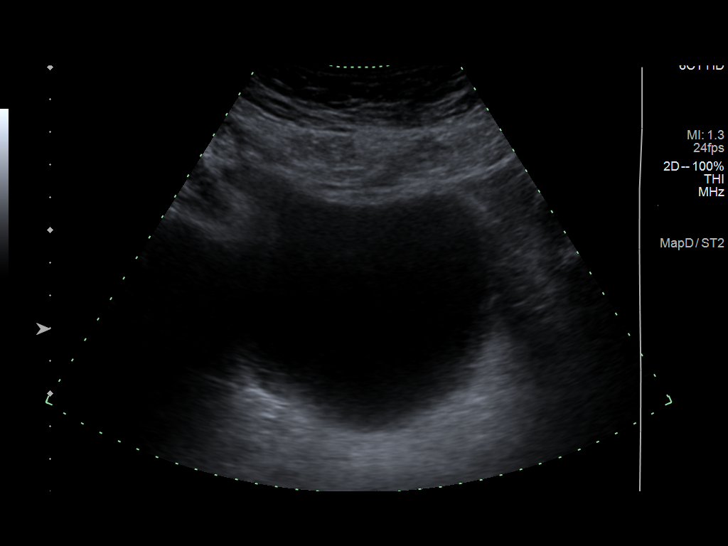
[im 56/62]
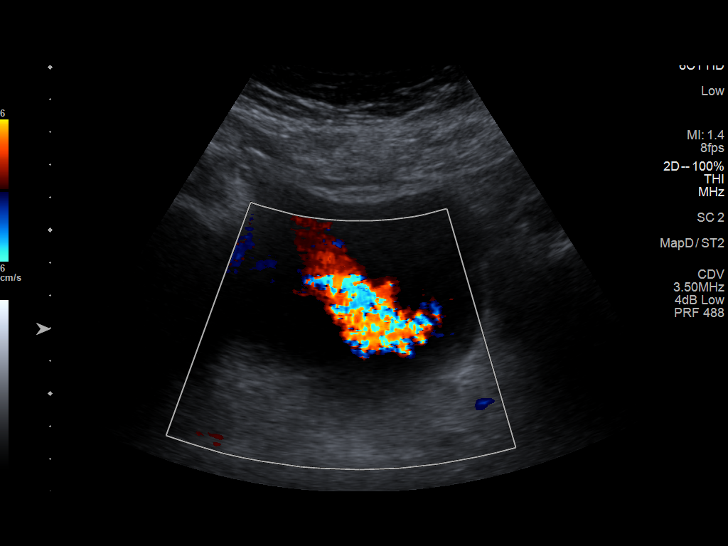
[im 62/62]
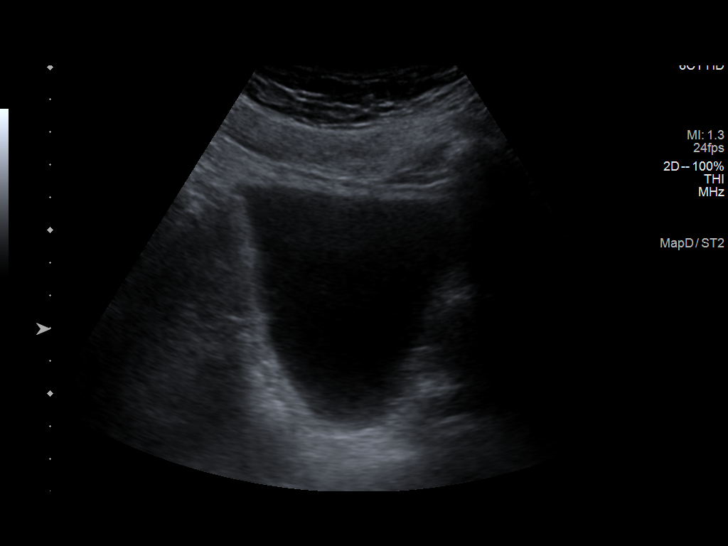

[13 of 25 positions shown; findings below may reference images not displayed]

FINDINGS: Right Kidney:

Renal measurements: 10.6 x 6 x 5 x 5.2 cm = volume: 187 mL. No
hydronephrosis. Increased renal cortical echogenicity. There is a
benign simple cyst of the superior pole which measures 3.9 x 5.1 x
4.0 cm. In the interpolar kidney, there is revisualization of an
anechoic mass with posterior acoustic enhancement which measures 18
x 16 by 15 mm, similar in comparison to priors and most consistent
with a cyst. Previously described minimally complicated cyst of the
inferior pole of the RIGHT kidney is not definitively visualized on
today's exam.

Left Kidney:

Renal measurements: 11.1 x 5.1 x 4.7 cm = volume: 139 mL. No
hydronephrosis. Increased renal echogenicity. In the superior pole,
there is a anechoic mass with posterior acoustic enhancement and
suggestion of a thin internal septation which measures 2.4 x 1.1 by
1.3 cm, consistent with a minimally complicated cyst. This is
similar in comparison to priors.

Bladder:

Appears normal for degree of bladder distention.

Other:

None.
IMPRESSION: 1. No hydronephrosis. Mildly increased renal echogenicity as can be
seen in medical renal disease.
2. Bilateral renal cysts. Mildly complicated RIGHT renal cyst is no
longer definitively visualized, possibly due to technique. Minimally
complicated LEFT renal cyst is similar in comparison to prior.

## 2023-02-04 DIAGNOSIS — Z0000 Encounter for general adult medical examination without abnormal findings: Secondary | ICD-10-CM | POA: Diagnosis not present

## 2023-02-04 DIAGNOSIS — G4701 Insomnia due to medical condition: Secondary | ICD-10-CM | POA: Diagnosis not present

## 2023-02-04 DIAGNOSIS — G602 Neuropathy in association with hereditary ataxia: Secondary | ICD-10-CM | POA: Diagnosis not present

## 2023-02-04 DIAGNOSIS — L500 Allergic urticaria: Secondary | ICD-10-CM | POA: Diagnosis not present

## 2023-02-04 DIAGNOSIS — Z6829 Body mass index (BMI) 29.0-29.9, adult: Secondary | ICD-10-CM | POA: Diagnosis not present

## 2023-02-04 DIAGNOSIS — I10 Essential (primary) hypertension: Secondary | ICD-10-CM | POA: Diagnosis not present

## 2023-02-17 DIAGNOSIS — F419 Anxiety disorder, unspecified: Secondary | ICD-10-CM | POA: Diagnosis not present

## 2023-02-17 DIAGNOSIS — Z8249 Family history of ischemic heart disease and other diseases of the circulatory system: Secondary | ICD-10-CM | POA: Diagnosis not present

## 2023-02-17 DIAGNOSIS — K219 Gastro-esophageal reflux disease without esophagitis: Secondary | ICD-10-CM | POA: Diagnosis not present

## 2023-02-17 DIAGNOSIS — G4700 Insomnia, unspecified: Secondary | ICD-10-CM | POA: Diagnosis not present

## 2023-02-17 DIAGNOSIS — N1831 Chronic kidney disease, stage 3a: Secondary | ICD-10-CM | POA: Diagnosis not present

## 2023-02-17 DIAGNOSIS — Z008 Encounter for other general examination: Secondary | ICD-10-CM | POA: Diagnosis not present

## 2023-02-17 DIAGNOSIS — Z87891 Personal history of nicotine dependence: Secondary | ICD-10-CM | POA: Diagnosis not present

## 2023-02-17 DIAGNOSIS — I129 Hypertensive chronic kidney disease with stage 1 through stage 4 chronic kidney disease, or unspecified chronic kidney disease: Secondary | ICD-10-CM | POA: Diagnosis not present

## 2023-02-17 DIAGNOSIS — Z833 Family history of diabetes mellitus: Secondary | ICD-10-CM | POA: Diagnosis not present

## 2023-02-17 DIAGNOSIS — Z85828 Personal history of other malignant neoplasm of skin: Secondary | ICD-10-CM | POA: Diagnosis not present

## 2023-02-18 DIAGNOSIS — M810 Age-related osteoporosis without current pathological fracture: Secondary | ICD-10-CM | POA: Diagnosis not present

## 2023-02-18 DIAGNOSIS — M8589 Other specified disorders of bone density and structure, multiple sites: Secondary | ICD-10-CM | POA: Diagnosis not present

## 2023-06-03 DIAGNOSIS — Z1159 Encounter for screening for other viral diseases: Secondary | ICD-10-CM | POA: Diagnosis not present

## 2023-06-03 DIAGNOSIS — L500 Allergic urticaria: Secondary | ICD-10-CM | POA: Diagnosis not present

## 2023-06-03 DIAGNOSIS — G4701 Insomnia due to medical condition: Secondary | ICD-10-CM | POA: Diagnosis not present

## 2023-06-03 DIAGNOSIS — G602 Neuropathy in association with hereditary ataxia: Secondary | ICD-10-CM | POA: Diagnosis not present

## 2023-06-03 DIAGNOSIS — Z6830 Body mass index (BMI) 30.0-30.9, adult: Secondary | ICD-10-CM | POA: Diagnosis not present

## 2023-06-03 DIAGNOSIS — Z0000 Encounter for general adult medical examination without abnormal findings: Secondary | ICD-10-CM | POA: Diagnosis not present

## 2023-06-03 DIAGNOSIS — I10 Essential (primary) hypertension: Secondary | ICD-10-CM | POA: Diagnosis not present

## 2023-07-29 DIAGNOSIS — N189 Chronic kidney disease, unspecified: Secondary | ICD-10-CM | POA: Diagnosis not present

## 2023-07-29 DIAGNOSIS — D631 Anemia in chronic kidney disease: Secondary | ICD-10-CM | POA: Diagnosis not present

## 2023-07-29 DIAGNOSIS — R809 Proteinuria, unspecified: Secondary | ICD-10-CM | POA: Diagnosis not present

## 2023-08-04 DIAGNOSIS — N1831 Chronic kidney disease, stage 3a: Secondary | ICD-10-CM | POA: Diagnosis not present

## 2023-08-04 DIAGNOSIS — N2581 Secondary hyperparathyroidism of renal origin: Secondary | ICD-10-CM | POA: Diagnosis not present

## 2023-08-04 DIAGNOSIS — N281 Cyst of kidney, acquired: Secondary | ICD-10-CM | POA: Diagnosis not present

## 2023-08-04 DIAGNOSIS — I129 Hypertensive chronic kidney disease with stage 1 through stage 4 chronic kidney disease, or unspecified chronic kidney disease: Secondary | ICD-10-CM | POA: Diagnosis not present

## 2023-08-27 DIAGNOSIS — L500 Allergic urticaria: Secondary | ICD-10-CM | POA: Diagnosis not present

## 2023-08-27 DIAGNOSIS — Z6831 Body mass index (BMI) 31.0-31.9, adult: Secondary | ICD-10-CM | POA: Diagnosis not present

## 2023-08-27 DIAGNOSIS — G4701 Insomnia due to medical condition: Secondary | ICD-10-CM | POA: Diagnosis not present

## 2023-08-27 DIAGNOSIS — I10 Essential (primary) hypertension: Secondary | ICD-10-CM | POA: Diagnosis not present

## 2023-08-27 DIAGNOSIS — N182 Chronic kidney disease, stage 2 (mild): Secondary | ICD-10-CM | POA: Diagnosis not present

## 2023-08-27 DIAGNOSIS — G602 Neuropathy in association with hereditary ataxia: Secondary | ICD-10-CM | POA: Diagnosis not present

## 2023-08-27 DIAGNOSIS — M542 Cervicalgia: Secondary | ICD-10-CM | POA: Diagnosis not present

## 2023-09-04 DIAGNOSIS — M5481 Occipital neuralgia: Secondary | ICD-10-CM | POA: Diagnosis not present

## 2023-09-30 DIAGNOSIS — I10 Essential (primary) hypertension: Secondary | ICD-10-CM | POA: Diagnosis not present

## 2023-09-30 DIAGNOSIS — G4701 Insomnia due to medical condition: Secondary | ICD-10-CM | POA: Diagnosis not present

## 2023-09-30 DIAGNOSIS — Z6830 Body mass index (BMI) 30.0-30.9, adult: Secondary | ICD-10-CM | POA: Diagnosis not present

## 2023-09-30 DIAGNOSIS — G602 Neuropathy in association with hereditary ataxia: Secondary | ICD-10-CM | POA: Diagnosis not present

## 2023-09-30 DIAGNOSIS — L500 Allergic urticaria: Secondary | ICD-10-CM | POA: Diagnosis not present

## 2023-09-30 DIAGNOSIS — M542 Cervicalgia: Secondary | ICD-10-CM | POA: Diagnosis not present

## 2023-09-30 DIAGNOSIS — N182 Chronic kidney disease, stage 2 (mild): Secondary | ICD-10-CM | POA: Diagnosis not present

## 2023-10-07 DIAGNOSIS — M5481 Occipital neuralgia: Secondary | ICD-10-CM | POA: Diagnosis not present

## 2023-12-06 DIAGNOSIS — Z8249 Family history of ischemic heart disease and other diseases of the circulatory system: Secondary | ICD-10-CM | POA: Diagnosis not present

## 2023-12-06 DIAGNOSIS — Z87891 Personal history of nicotine dependence: Secondary | ICD-10-CM | POA: Diagnosis not present

## 2023-12-06 DIAGNOSIS — Z833 Family history of diabetes mellitus: Secondary | ICD-10-CM | POA: Diagnosis not present

## 2023-12-06 DIAGNOSIS — G4700 Insomnia, unspecified: Secondary | ICD-10-CM | POA: Diagnosis not present

## 2023-12-06 DIAGNOSIS — I509 Heart failure, unspecified: Secondary | ICD-10-CM | POA: Diagnosis not present

## 2023-12-06 DIAGNOSIS — I130 Hypertensive heart and chronic kidney disease with heart failure and stage 1 through stage 4 chronic kidney disease, or unspecified chronic kidney disease: Secondary | ICD-10-CM | POA: Diagnosis not present

## 2023-12-06 DIAGNOSIS — E785 Hyperlipidemia, unspecified: Secondary | ICD-10-CM | POA: Diagnosis not present

## 2023-12-06 DIAGNOSIS — F419 Anxiety disorder, unspecified: Secondary | ICD-10-CM | POA: Diagnosis not present

## 2023-12-06 DIAGNOSIS — N1831 Chronic kidney disease, stage 3a: Secondary | ICD-10-CM | POA: Diagnosis not present

## 2023-12-06 DIAGNOSIS — G629 Polyneuropathy, unspecified: Secondary | ICD-10-CM | POA: Diagnosis not present

## 2023-12-06 DIAGNOSIS — M810 Age-related osteoporosis without current pathological fracture: Secondary | ICD-10-CM | POA: Diagnosis not present

## 2023-12-30 DIAGNOSIS — Z0000 Encounter for general adult medical examination without abnormal findings: Secondary | ICD-10-CM | POA: Diagnosis not present

## 2023-12-30 DIAGNOSIS — E7849 Other hyperlipidemia: Secondary | ICD-10-CM | POA: Diagnosis not present

## 2023-12-30 DIAGNOSIS — N182 Chronic kidney disease, stage 2 (mild): Secondary | ICD-10-CM | POA: Diagnosis not present

## 2023-12-30 DIAGNOSIS — L500 Allergic urticaria: Secondary | ICD-10-CM | POA: Diagnosis not present

## 2023-12-30 DIAGNOSIS — Z125 Encounter for screening for malignant neoplasm of prostate: Secondary | ICD-10-CM | POA: Diagnosis not present

## 2023-12-30 DIAGNOSIS — G4701 Insomnia due to medical condition: Secondary | ICD-10-CM | POA: Diagnosis not present

## 2023-12-30 DIAGNOSIS — G602 Neuropathy in association with hereditary ataxia: Secondary | ICD-10-CM | POA: Diagnosis not present

## 2023-12-30 DIAGNOSIS — Z6831 Body mass index (BMI) 31.0-31.9, adult: Secondary | ICD-10-CM | POA: Diagnosis not present

## 2023-12-30 DIAGNOSIS — I10 Essential (primary) hypertension: Secondary | ICD-10-CM | POA: Diagnosis not present

## 2023-12-30 DIAGNOSIS — M542 Cervicalgia: Secondary | ICD-10-CM | POA: Diagnosis not present

## 2024-01-16 DIAGNOSIS — I129 Hypertensive chronic kidney disease with stage 1 through stage 4 chronic kidney disease, or unspecified chronic kidney disease: Secondary | ICD-10-CM | POA: Diagnosis not present

## 2024-01-16 DIAGNOSIS — I5032 Chronic diastolic (congestive) heart failure: Secondary | ICD-10-CM | POA: Diagnosis not present

## 2024-01-16 DIAGNOSIS — N1831 Chronic kidney disease, stage 3a: Secondary | ICD-10-CM | POA: Diagnosis not present

## 2024-01-16 DIAGNOSIS — N2581 Secondary hyperparathyroidism of renal origin: Secondary | ICD-10-CM | POA: Diagnosis not present

## 2024-01-21 DIAGNOSIS — N2581 Secondary hyperparathyroidism of renal origin: Secondary | ICD-10-CM | POA: Diagnosis not present

## 2024-01-21 DIAGNOSIS — N1831 Chronic kidney disease, stage 3a: Secondary | ICD-10-CM | POA: Diagnosis not present

## 2024-01-21 DIAGNOSIS — I129 Hypertensive chronic kidney disease with stage 1 through stage 4 chronic kidney disease, or unspecified chronic kidney disease: Secondary | ICD-10-CM | POA: Diagnosis not present

## 2024-01-21 DIAGNOSIS — N281 Cyst of kidney, acquired: Secondary | ICD-10-CM | POA: Diagnosis not present

## 2024-04-05 DIAGNOSIS — I10 Essential (primary) hypertension: Secondary | ICD-10-CM | POA: Diagnosis not present

## 2024-04-05 DIAGNOSIS — G4701 Insomnia due to medical condition: Secondary | ICD-10-CM | POA: Diagnosis not present

## 2024-04-05 DIAGNOSIS — N1831 Chronic kidney disease, stage 3a: Secondary | ICD-10-CM | POA: Diagnosis not present

## 2024-04-05 DIAGNOSIS — M542 Cervicalgia: Secondary | ICD-10-CM | POA: Diagnosis not present

## 2024-04-05 DIAGNOSIS — Z6830 Body mass index (BMI) 30.0-30.9, adult: Secondary | ICD-10-CM | POA: Diagnosis not present

## 2024-04-05 DIAGNOSIS — G602 Neuropathy in association with hereditary ataxia: Secondary | ICD-10-CM | POA: Diagnosis not present

## 2024-04-05 DIAGNOSIS — Z0000 Encounter for general adult medical examination without abnormal findings: Secondary | ICD-10-CM | POA: Diagnosis not present

## 2024-04-05 DIAGNOSIS — E7849 Other hyperlipidemia: Secondary | ICD-10-CM | POA: Diagnosis not present

## 2024-07-15 DIAGNOSIS — G4701 Insomnia due to medical condition: Secondary | ICD-10-CM | POA: Diagnosis not present

## 2024-07-15 DIAGNOSIS — E7849 Other hyperlipidemia: Secondary | ICD-10-CM | POA: Diagnosis not present

## 2024-07-15 DIAGNOSIS — I10 Essential (primary) hypertension: Secondary | ICD-10-CM | POA: Diagnosis not present

## 2024-07-15 DIAGNOSIS — N1831 Chronic kidney disease, stage 3a: Secondary | ICD-10-CM | POA: Diagnosis not present

## 2024-07-15 DIAGNOSIS — R0602 Shortness of breath: Secondary | ICD-10-CM | POA: Diagnosis not present

## 2024-07-15 DIAGNOSIS — M542 Cervicalgia: Secondary | ICD-10-CM | POA: Diagnosis not present

## 2024-07-15 DIAGNOSIS — Z6829 Body mass index (BMI) 29.0-29.9, adult: Secondary | ICD-10-CM | POA: Diagnosis not present

## 2024-07-15 DIAGNOSIS — G602 Neuropathy in association with hereditary ataxia: Secondary | ICD-10-CM | POA: Diagnosis not present

## 2024-08-16 DIAGNOSIS — N1831 Chronic kidney disease, stage 3a: Secondary | ICD-10-CM | POA: Diagnosis not present

## 2024-08-16 DIAGNOSIS — I10 Essential (primary) hypertension: Secondary | ICD-10-CM | POA: Diagnosis not present

## 2024-09-13 DIAGNOSIS — N1831 Chronic kidney disease, stage 3a: Secondary | ICD-10-CM | POA: Diagnosis not present

## 2024-09-13 DIAGNOSIS — I10 Essential (primary) hypertension: Secondary | ICD-10-CM | POA: Diagnosis not present
